# Patient Record
Sex: Female | Born: 1975 | Hispanic: No | Marital: Married | Smoking: Never smoker
Health system: Southern US, Community
[De-identification: ages and names within clinical notes are randomized; demographics above are authoritative.]

## PROBLEM LIST (undated history)

## (undated) DIAGNOSIS — N200 Calculus of kidney: Secondary | ICD-10-CM

## (undated) HISTORY — PX: LITHOTRIPSY: SUR834

---

## 2014-11-05 DIAGNOSIS — Z87440 Personal history of urinary (tract) infections: Secondary | ICD-10-CM | POA: Diagnosis not present

## 2014-11-05 DIAGNOSIS — Z792 Long term (current) use of antibiotics: Secondary | ICD-10-CM | POA: Insufficient documentation

## 2014-11-05 DIAGNOSIS — N200 Calculus of kidney: Secondary | ICD-10-CM | POA: Diagnosis not present

## 2014-11-05 DIAGNOSIS — Z3202 Encounter for pregnancy test, result negative: Secondary | ICD-10-CM | POA: Diagnosis not present

## 2014-11-05 DIAGNOSIS — Z79899 Other long term (current) drug therapy: Secondary | ICD-10-CM | POA: Diagnosis not present

## 2014-11-05 DIAGNOSIS — R109 Unspecified abdominal pain: Secondary | ICD-10-CM | POA: Diagnosis present

## 2014-11-06 ENCOUNTER — Emergency Department (HOSPITAL_COMMUNITY): Payer: 59

## 2014-11-06 ENCOUNTER — Encounter (HOSPITAL_COMMUNITY): Payer: Self-pay | Admitting: *Deleted

## 2014-11-06 ENCOUNTER — Emergency Department (HOSPITAL_COMMUNITY)
Admission: EM | Admit: 2014-11-06 | Discharge: 2014-11-06 | Disposition: A | Discharge status: Home / Self Care | Payer: 59 | Attending: Emergency Medicine | Admitting: Emergency Medicine

## 2014-11-06 DIAGNOSIS — N200 Calculus of kidney: Secondary | ICD-10-CM

## 2014-11-06 HISTORY — DX: Calculus of kidney: N20.0

## 2014-11-06 LAB — CBC WITH DIFFERENTIAL/PLATELET
BASOS PCT: 0 % (ref 0–1)
Basophils Absolute: 0 10*3/uL (ref 0.0–0.1)
EOS ABS: 0 10*3/uL (ref 0.0–0.7)
Eosinophils Relative: 0 % (ref 0–5)
HCT: 32.3 % — ABNORMAL LOW (ref 36.0–46.0)
Hemoglobin: 10.5 g/dL — ABNORMAL LOW (ref 12.0–15.0)
LYMPHS ABS: 1 10*3/uL (ref 0.7–4.0)
Lymphocytes Relative: 8 % — ABNORMAL LOW (ref 12–46)
MCH: 30 pg (ref 26.0–34.0)
MCHC: 32.5 g/dL (ref 30.0–36.0)
MCV: 92.3 fL (ref 78.0–100.0)
MONO ABS: 0.4 10*3/uL (ref 0.1–1.0)
Monocytes Relative: 4 % (ref 3–12)
NEUTROS PCT: 88 % — AB (ref 43–77)
Neutro Abs: 9.9 10*3/uL — ABNORMAL HIGH (ref 1.7–7.7)
PLATELETS: 250 10*3/uL (ref 150–400)
RBC: 3.5 MIL/uL — ABNORMAL LOW (ref 3.87–5.11)
RDW: 13 % (ref 11.5–15.5)
WBC: 11.3 10*3/uL — ABNORMAL HIGH (ref 4.0–10.5)

## 2014-11-06 LAB — COMPREHENSIVE METABOLIC PANEL
ALT: 13 U/L (ref 0–35)
AST: 12 U/L (ref 0–37)
Albumin: 3.2 g/dL — ABNORMAL LOW (ref 3.5–5.2)
Alkaline Phosphatase: 48 U/L (ref 39–117)
Anion gap: 3 — ABNORMAL LOW (ref 5–15)
BUN: 16 mg/dL (ref 6–23)
CHLORIDE: 113 mmol/L — AB (ref 96–112)
CO2: 23 mmol/L (ref 19–32)
Calcium: 7.4 mg/dL — ABNORMAL LOW (ref 8.4–10.5)
Creatinine, Ser: 0.52 mg/dL (ref 0.50–1.10)
GFR calc Af Amer: >90 mL/min (ref >90)
GFR calc non Af Amer: >90 mL/min (ref >90)
Glucose, Bld: 129 mg/dL — ABNORMAL HIGH (ref 70–99)
POTASSIUM: 3.7 mmol/L (ref 3.5–5.1)
Sodium: 139 mmol/L (ref 135–145)
Total Bilirubin: 0.3 mg/dL (ref 0.3–1.2)
Total Protein: 5.7 g/dL — ABNORMAL LOW (ref 6.0–8.3)

## 2014-11-06 LAB — URINALYSIS, ROUTINE W REFLEX MICROSCOPIC
Bilirubin Urine: NEGATIVE
Glucose, UA: NEGATIVE mg/dL
Ketones, ur: NEGATIVE mg/dL
NITRITE: NEGATIVE
Protein, ur: 30 mg/dL — AB
Specific Gravity, Urine: 1.025 (ref 1.005–1.030)
Urobilinogen, UA: 0.2 mg/dL (ref 0.0–1.0)
pH: 5.5 (ref 5.0–8.0)

## 2014-11-06 LAB — POC URINE PREG, ED: PREG TEST UR: NEGATIVE

## 2014-11-06 LAB — URINE MICROSCOPIC-ADD ON

## 2014-11-06 LAB — LIPASE, BLOOD: LIPASE: 21 U/L (ref 11–59)

## 2014-11-06 MED ORDER — ONDANSETRON HCL 4 MG/2ML IJ SOLN
4.0000 mg | Freq: Once | INTRAMUSCULAR | Status: AC
  Administered 2014-11-06: 4 mg via INTRAVENOUS
  Filled 2014-11-06: qty 2

## 2014-11-06 MED ORDER — MORPHINE SULFATE 4 MG/ML IJ SOLN
4.0000 mg | Freq: Once | INTRAMUSCULAR | Status: AC
  Administered 2014-11-06: 4 mg via INTRAVENOUS
  Filled 2014-11-06: qty 1

## 2014-11-06 MED ORDER — ONDANSETRON 4 MG PO TBDP: 4.0000 mg | ORAL_TABLET | Freq: Three times a day (TID) | ORAL | Status: DC | PRN

## 2014-11-06 MED ORDER — IOHEXOL 300 MG/ML  SOLN: 50.0000 mL | Freq: Once | INTRAMUSCULAR | Status: DC | PRN

## 2014-11-06 MED ORDER — SODIUM CHLORIDE 0.9 % IV SOLN
Freq: Once | INTRAVENOUS | Status: AC
  Administered 2014-11-06: 03:00:00 via INTRAVENOUS

## 2014-11-06 MED ORDER — SODIUM CHLORIDE 0.9 % IV BOLUS (SEPSIS)
1000.0000 mL | Freq: Once | INTRAVENOUS | Status: AC
  Administered 2014-11-06: 1000 mL via INTRAVENOUS

## 2014-11-06 MED ORDER — CEFTRIAXONE SODIUM 1 G IJ SOLR
1.0000 g | Freq: Once | INTRAMUSCULAR | Status: AC
  Administered 2014-11-06: 1 g via INTRAVENOUS
  Filled 2014-11-06: qty 10

## 2014-11-06 MED ORDER — OXYCODONE-ACETAMINOPHEN 5-325 MG PO TABS: 2.0000 | ORAL_TABLET | ORAL | Status: DC | PRN

## 2014-11-06 MED ORDER — HYDROMORPHONE HCL 1 MG/ML IJ SOLN
1.0000 mg | Freq: Once | INTRAMUSCULAR | Status: AC
  Administered 2014-11-06: 1 mg via INTRAVENOUS
  Filled 2014-11-06: qty 1

## 2014-11-06 MED ORDER — KETOROLAC TROMETHAMINE 30 MG/ML IJ SOLN
30.0000 mg | Freq: Once | INTRAMUSCULAR | Status: AC
  Administered 2014-11-06: 30 mg via INTRAVENOUS
  Filled 2014-11-06: qty 1

## 2014-11-06 NOTE — Discharge Instructions (Signed)
Take Percocet as needed for pain. Take zofran as needed for nausea. Refer to attached documents for more information.  °

## 2014-11-06 NOTE — ED Notes (Signed)
Patient being treated currently for kidney infection with antibiotics by mouth. According to EMS, the patient was doubled over in pain upon arrival. Patient has had blood in her urine since being seen for kidney infection.

## 2014-11-06 NOTE — ED Notes (Signed)
POC Preg Urine Neg.

## 2014-11-06 NOTE — ED Notes (Signed)
Bed: WA21 Expected date:  Expected time:  Means of arrival:  Comments: 26F Abd pain

## 2014-11-06 NOTE — ED Provider Notes (Signed)
CSN: [id number]     Arrival date & time 11/05/14  2359 History   First MD Initiated Contact with Patient 11/06/14 0048     Chief Complaint  Patient presents with  . Abdominal Pain     (Consider location/radiation/quality/duration/timing/severity/associated sxs/prior Treatment) HPI Comments: Patient is a 39 year old female who presents with flank pain since yesterday. The pain is located in the right flank and radiates to her right abdomen. The pain is described as sharp and severe. The pain started gradually and progressively worsened since the onset. Patient reports recently being treated for a UTI with Cipro. She thinks she has had the UTI for so long that her kidneys are infection. No alleviating/aggravating factors. The patient has tried nothing for symptoms without relief. Associated symptoms include hematuria. Patient denies fever, headache, NVD, chest pain, SOB, dysuria, constipation, abnormal vaginal bleeding/discharge. No history of abdominal surgery.      Past Medical History  Diagnosis Date  . Kidney stones   . Kidney stone   . Kidney stone    History reviewed. No pertinent past surgical history. No family history on file. History  Substance Use Topics  . Smoking status: Never Smoker   . Smokeless tobacco: Not on file  . Alcohol Use: Yes     Comment: rarely   OB History    No data available     Review of Systems  Constitutional: Negative for fever, chills and fatigue.  HENT: Negative for trouble swallowing.   Eyes: Negative for visual disturbance.  Respiratory: Negative for shortness of breath.   Cardiovascular: Negative for chest pain and palpitations.  Gastrointestinal: Negative for nausea, vomiting, abdominal pain and diarrhea.  Genitourinary: Positive for hematuria and flank pain. Negative for dysuria and difficulty urinating.  Musculoskeletal: Negative for arthralgias and neck pain.  Skin: Negative for color change.  Neurological: Negative for dizziness and  weakness.  Psychiatric/Behavioral: Negative for dysphoric mood.      Allergies  Review of patient's allergies indicates no known allergies.  Home Medications   Prior to Admission medications   Medication Sig Start Date End Date Taking? Authorizing Provider  Azelastine-Fluticasone 137-50 MCG/ACT SUSP Place 1 spray into the nose daily.   Yes Historical Provider, MD  ciprofloxacin (CIPRO) 500 MG tablet Take 500 mg by mouth 2 (two) times daily.   Yes Historical Provider, MD   BP 105/67 mmHg  Pulse 82  Temp(Src) 98 F (36.7 C) (Oral)  Resp 19  Ht  (1.575 m)  Wt 118 lb (53.524 kg)  BMI 21.58 kg/m2  SpO2 100%  LMP 10/07/2014 (Exact Date) Physical Exam  Constitutional: She is oriented to person, place, and time. She appears well-developed and well-nourished. No distress.  HENT:  Head: Normocephalic and atraumatic.  Eyes: Conjunctivae and EOM are normal.  Neck: Normal range of motion.  Cardiovascular: Normal rate and regular rhythm.  Exam reveals no gallop and no friction rub.   No murmur heard. Pulmonary/Chest: Effort normal and breath sounds normal. She has no wheezes. She has no rales. She exhibits no tenderness.  Abdominal: Soft. She exhibits no distension. There is tenderness. There is no rebound.  Mild right sided abdominal tenderness to palpation. No focal tenderness or peritoneal signs.   Genitourinary:  Right CVA tenderness.   Musculoskeletal: Normal range of motion.  Neurological: She is alert and oriented to person, place, and time. Coordination normal.  Speech is goal-oriented. Moves limbs without ataxia.   Skin: Skin is warm and dry.  Psychiatric: She has  a normal mood and affect. Her behavior is normal.  Nursing note and vitals reviewed.   ED Course  Procedures (including critical care time) Labs Review Labs Reviewed  URINALYSIS, ROUTINE W REFLEX MICROSCOPIC - Abnormal; Notable for the following:    Color, Urine RED (*)    APPearance TURBID (*)    Hgb  urine dipstick LARGE (*)    Protein, ur 30 (*)    Leukocytes, UA MODERATE (*)    All other components within normal limits  CBC WITH DIFFERENTIAL/PLATELET - Abnormal; Notable for the following:    WBC 11.3 (*)    RBC 3.50 (*)    Hemoglobin 10.5 (*)    HCT 32.3 (*)    Neutrophils Relative % 88 (*)    Neutro Abs 9.9 (*)    Lymphocytes Relative 8 (*)    All other components within normal limits  COMPREHENSIVE METABOLIC PANEL - Abnormal; Notable for the following:    Chloride 113 (*)    Glucose, Bld 129 (*)    Calcium 7.4 (*)    Total Protein 5.7 (*)    Albumin 3.2 (*)    Anion gap 3 (*)    All other components within normal limits  LIPASE, BLOOD  URINE MICROSCOPIC-ADD ON  POC URINE PREG, ED    Imaging Review Ct Abdomen Pelvis Wo Contrast  11/06/2014   CLINICAL DATA:  Right flank pain for 2 weeks.  EXAM: CT ABDOMEN AND PELVIS WITHOUT CONTRAST  TECHNIQUE: Multidetector CT imaging of the abdomen and pelvis was performed following the standard protocol without IV contrast.  COMPARISON:  None.  FINDINGS: There is hydronephrosis and ureteral dilatation on the right. There is a 2 mm distal right ureteral calculus approximately 2 cm proximal to the ureteral vesicle junction. This is seen to best advantage on coronal image 50. No other ureteral calculi are evident. There are 3-4 mm collecting system calculi in both kidneys.  There are unremarkable unenhanced appearances of the liver, spleen, pancreas and adrenals.Mesentery and bowel appear unremarkable. Appendix is normal. No significant abnormalities are evident in the lower chest. Incidentally noted small fat containing umbilical hernia.  IMPRESSION: 2 mm distal right ureteral calculus with hydroureter and hydronephrosis. Bilateral nephrolithiasis. Small fat containing umbilical hernia.   Electronically Signed   By: Ellery Plunkaniel R Mitchell M.D.   On: 11/06/2014 03:42     EKG Interpretation None      MDM   Final diagnoses:  Kidney stone on  right side    2:14 AM Labs pending. Urinalysis shows blood with leukocytes. Vitals stable and patient afebrile.   4:15 AM Patient's CT shows right uretal stone. Patient reports relief after toradol. Patient will be discharged with percocet and zofran. Vitals stable and patient afebrile. Patient instructed to return with worsening or concerning symptoms.   [address]Jearld Hemp FreeportSzekalski, PA-C 11/06/14 [id number]  Loren Raceravid Yelverton, MD 11/06/14 [phone]

## 2015-08-04 ENCOUNTER — Emergency Department (HOSPITAL_BASED_OUTPATIENT_CLINIC_OR_DEPARTMENT_OTHER)
Admission: EM | Admit: 2015-08-04 | Discharge: 2015-08-05 | Disposition: A | Discharge status: Home / Self Care | Payer: 59 | Attending: Emergency Medicine | Admitting: Emergency Medicine

## 2015-08-04 ENCOUNTER — Encounter (HOSPITAL_BASED_OUTPATIENT_CLINIC_OR_DEPARTMENT_OTHER): Payer: Self-pay | Admitting: *Deleted

## 2015-08-04 ENCOUNTER — Emergency Department (HOSPITAL_BASED_OUTPATIENT_CLINIC_OR_DEPARTMENT_OTHER): Payer: 59

## 2015-08-04 DIAGNOSIS — Z79899 Other long term (current) drug therapy: Secondary | ICD-10-CM | POA: Insufficient documentation

## 2015-08-04 DIAGNOSIS — M6281 Muscle weakness (generalized): Secondary | ICD-10-CM | POA: Diagnosis not present

## 2015-08-04 DIAGNOSIS — R319 Hematuria, unspecified: Secondary | ICD-10-CM | POA: Diagnosis present

## 2015-08-04 DIAGNOSIS — Z792 Long term (current) use of antibiotics: Secondary | ICD-10-CM | POA: Insufficient documentation

## 2015-08-04 DIAGNOSIS — Z87442 Personal history of urinary calculi: Secondary | ICD-10-CM | POA: Insufficient documentation

## 2015-08-04 DIAGNOSIS — N390 Urinary tract infection, site not specified: Secondary | ICD-10-CM

## 2015-08-04 DIAGNOSIS — Z3202 Encounter for pregnancy test, result negative: Secondary | ICD-10-CM | POA: Insufficient documentation

## 2015-08-04 DIAGNOSIS — Z7951 Long term (current) use of inhaled steroids: Secondary | ICD-10-CM | POA: Insufficient documentation

## 2015-08-04 DIAGNOSIS — R509 Fever, unspecified: Secondary | ICD-10-CM

## 2015-08-04 DIAGNOSIS — R109 Unspecified abdominal pain: Secondary | ICD-10-CM

## 2015-08-04 LAB — CBC
HCT: 34.3 % — ABNORMAL LOW (ref 36.0–46.0)
Hemoglobin: 11.3 g/dL — ABNORMAL LOW (ref 12.0–15.0)
MCH: 28.6 pg (ref 26.0–34.0)
MCHC: 32.9 g/dL (ref 30.0–36.0)
MCV: 86.8 fL (ref 78.0–100.0)
PLATELETS: 249 10*3/uL (ref 150–400)
RBC: 3.95 MIL/uL (ref 3.87–5.11)
RDW: 12.8 % (ref 11.5–15.5)
WBC: 4.2 10*3/uL (ref 4.0–10.5)

## 2015-08-04 LAB — URINALYSIS, ROUTINE W REFLEX MICROSCOPIC
Glucose, UA: NEGATIVE mg/dL
Ketones, ur: 15 mg/dL — AB
Nitrite: POSITIVE — AB
PROTEIN: 100 mg/dL — AB
SPECIFIC GRAVITY, URINE: 1.026 (ref 1.005–1.030)
pH: 7 (ref 5.0–8.0)

## 2015-08-04 LAB — URINE MICROSCOPIC-ADD ON

## 2015-08-04 LAB — BASIC METABOLIC PANEL
Anion gap: 6 (ref 5–15)
BUN: 11 mg/dL (ref 6–20)
CALCIUM: 8.5 mg/dL — AB (ref 8.9–10.3)
CO2: 26 mmol/L (ref 22–32)
CREATININE: 0.51 mg/dL (ref 0.44–1.00)
Chloride: 104 mmol/L (ref 101–111)
GFR calc Af Amer: >60 mL/min (ref >60)
GLUCOSE: 127 mg/dL — AB (ref 65–99)
Potassium: 3.7 mmol/L (ref 3.5–5.1)
SODIUM: 136 mmol/L (ref 135–145)

## 2015-08-04 LAB — PREGNANCY, URINE: PREG TEST UR: NEGATIVE

## 2015-08-04 MED ORDER — ONDANSETRON HCL 4 MG PO TABS: 4.0000 mg | ORAL_TABLET | Freq: Four times a day (QID) | ORAL | Status: DC

## 2015-08-04 MED ORDER — CEFTRIAXONE SODIUM 1 G IJ SOLR
1.0000 g | Freq: Once | INTRAMUSCULAR | Status: AC
  Administered 2015-08-04: 1 g via INTRAVENOUS

## 2015-08-04 MED ORDER — FLUCONAZOLE 150 MG PO TABS: 150.0000 mg | ORAL_TABLET | Freq: Every day | ORAL | Status: AC

## 2015-08-04 MED ORDER — CEPHALEXIN 500 MG PO CAPS: 500.0000 mg | ORAL_CAPSULE | Freq: Four times a day (QID) | ORAL | Status: DC

## 2015-08-04 MED ORDER — CEFTRIAXONE SODIUM 1 G IJ SOLR
INTRAMUSCULAR | Status: AC
  Filled 2015-08-04: qty 10

## 2015-08-04 MED ORDER — KETOROLAC TROMETHAMINE 30 MG/ML IJ SOLN
30.0000 mg | Freq: Once | INTRAMUSCULAR | Status: AC
  Administered 2015-08-04: 30 mg via INTRAVENOUS
  Filled 2015-08-04: qty 1

## 2015-08-04 MED ORDER — SODIUM CHLORIDE 0.9 % IV BOLUS (SEPSIS)
1000.0000 mL | Freq: Once | INTRAVENOUS | Status: AC
  Administered 2015-08-04: 1000 mL via INTRAVENOUS

## 2015-08-04 NOTE — ED Notes (Signed)
Hematuria x 2 weeks- hx of kidney stones- went to PCP today and sent here for eval- Fever x 3 days

## 2015-08-04 NOTE — Discharge Instructions (Signed)
Return to the ED with any concerns including vomiting and not able to keep down liquids, fever/chills, worsening abdominal pain, decreased level of alertness/lethargy, or any other alarming symptoms °

## 2015-08-04 NOTE — ED Notes (Signed)
Patient asked if she could eat advised her not to until doctor sees her per suzanne RN

## 2015-08-04 NOTE — ED Provider Notes (Signed)
CSN: [id number]     Arrival date & time 08/04/15  1751 History  By signing my name below, I, Doreatha MartinEva Mathews, attest that this documentation has been prepared under the direction and in the presence of Jerelyn ScottMartha Linker, MD. Electronically Signed: Doreatha MartinEva Mathews, ED Scribe. 08/04/2015. 9:42 PM.    Chief Complaint  Patient presents with  . Hematuria   Patient is a 39 y.o. female presenting with hematuria. The history is provided by the patient. No language interpreter was used.  Hematuria This is a recurrent problem. The current episode started more than 1 week ago. The problem occurs daily. The problem has not changed since onset.Associated symptoms include abdominal pain. Nothing aggravates the symptoms. Nothing relieves the symptoms. She has tried nothing for the symptoms. The treatment provided no relief.    HPI Comments: Krista Peterson is a 39 y.o. female who presents to the Emergency Department complaining of moderate hematuria onset 2 weeks ago with associated fever and chills onset 3 days ago, bilateral flank pain (worse on the right) for a week, generalized weakness, periumbilical and lower abdominal pain, dysuria. Pt was evaluated by her PCP earlier today who referred her to the ED. Pt states h/o similar symptoms with renal calculi and lithotripsy. Pt notes that she has had hematuria with prior kidney stones. Pt is followed by [facility]. NKDA. She denies emesis, nausea.   Past Medical History  Diagnosis Date  . Kidney stones   . Kidney stone   . Kidney stone    Past Surgical History  Procedure Laterality Date  . Lithotripsy     No family history on file. Social History  Substance Use Topics  . Smoking status: Never Smoker   . Smokeless tobacco: None  . Alcohol Use: Yes     Comment: rarely   OB History    No data available     Review of Systems  Constitutional: Positive for fever and chills.  Gastrointestinal: Positive for abdominal pain. Negative for nausea and vomiting.   Genitourinary: Positive for dysuria, hematuria and flank pain.  Neurological: Positive for weakness.  All other systems reviewed and are negative.  Allergies  Review of patient's allergies indicates no known allergies.  Home Medications   Prior to Admission medications   Medication Sig Start Date End Date Taking? Authorizing Provider  Azelastine-Fluticasone 137-50 MCG/ACT SUSP Place 1 spray into the nose daily.    Historical Provider, MD  cephALEXin (KEFLEX) 500 MG capsule Take 1 capsule (500 mg total) by mouth 4 (four) times daily. 08/04/15   Jerelyn ScottMartha Linker, MD  ciprofloxacin (CIPRO) 500 MG tablet Take 500 mg by mouth 2 (two) times daily.    Historical Provider, MD  fluconazole (DIFLUCAN) 150 MG tablet Take 1 tablet (150 mg total) by mouth daily. 08/04/15 08/11/15  Jerelyn ScottMartha Linker, MD  ondansetron (ZOFRAN ODT) 4 MG disintegrating tablet Take 1 tablet (4 mg total) by mouth every 8 (eight) hours as needed for nausea or vomiting. 11/06/14   Emilia BeckKaitlyn Szekalski, PA-C  ondansetron (ZOFRAN) 4 MG tablet Take 1 tablet (4 mg total) by mouth every 6 (six) hours. 08/04/15   Jerelyn ScottMartha Linker, MD  oxyCODONE-acetaminophen (PERCOCET/ROXICET) 5-325 MG per tablet Take 2 tablets by mouth every 4 (four) hours as needed for moderate pain or severe pain. 11/06/14   Kaitlyn Szekalski, PA-C   BP 95/60 mmHg  Pulse 93  Temp(Src) 98.9 F (37.2 C) (Oral)  Resp 18  Ht 5\' 2"  (1.575 m)  Wt 116 lb (52.617 kg)  BMI 21.21  kg/m2  SpO2 99%  LMP 07/31/2015  Vitals reviewed Physical Exam  Physical Examination: General appearance - alert, well appearing, and in no distress Mental status - alert, oriented to person, place, and time Eyes - no conjunctival injection, no scleral icterus Mouth - mucous membranes moist, pharynx normal without lesions Chest - clear to auscultation, no wheezes, rales or rhonchi, symmetric air entry Heart - normal rate, regular rhythm, normal S1, S2, no murmurs, rubs, clicks or gallops Abdomen -  soft, nontender, nondistended, no masses or organomegaly Back exam - no midline tenderness to palpation, no CVA tenderness Neurological - alert, oriented, normal speech Extremities - peripheral pulses normal, no pedal edema, no clubbing or cyanosis Skin - normal coloration and turgor, no rashes  ED Course  Procedures (including critical care time) DIAGNOSTIC STUDIES: Oxygen Saturation is 99% on RA, normal by my interpretation.    COORDINATION OF CARE: 9:42 PM Discussed treatment plan with pt at bedside and pt agreed to plan.   Labs Review Labs Reviewed  URINALYSIS, ROUTINE W REFLEX MICROSCOPIC (NOT AT [facility]) - Abnormal; Notable for the following:    Color, Urine RED (*)    APPearance TURBID (*)    Hgb urine dipstick LARGE (*)    Bilirubin Urine SMALL (*)    Ketones, ur 15 (*)    Protein, ur 100 (*)    Nitrite POSITIVE (*)    Leukocytes, UA MODERATE (*)    All other components within normal limits  URINE MICROSCOPIC-ADD ON - Abnormal; Notable for the following:    Squamous Epithelial / LPF 0-5 (*)    Bacteria, UA MANY (*)    All other components within normal limits  CBC - Abnormal; Notable for the following:    Hemoglobin 11.3 (*)    HCT 34.3 (*)    All other components within normal limits  BASIC METABOLIC PANEL - Abnormal; Notable for the following:    Glucose, Bld 127 (*)    Calcium 8.5 (*)    All other components within normal limits  PREGNANCY, URINE    Imaging Review Dg Chest 2 View  08/04/2015  CLINICAL DATA:  Fever and chills for 3 days. Bilateral flank pain and hematuria. EXAM: CHEST  2 VIEW COMPARISON:  Abdominal CT earlier this day FINDINGS: Small right pleural effusion with adjacent minimal basilar opacity, most consistent with compressive atelectasis. The heart size and mediastinal contours are normal. There is no pulmonary edema. No left pleural effusion. No pneumothorax. No osseous abnormality. IMPRESSION: Small right pleural effusion with adjacent basilar  opacity, likely atelectasis; however pneumonia with small parapneumonic effusion is considered in the setting of fever. Electronically Signed   By: Rubye Oaks M.D.   On: 08/04/2015 22:59   Ct Renal Stone Study  08/04/2015  CLINICAL DATA:  Right-sided flank pain and hematuria for 1-2 weeks. Onset of fever and chills 3 days ago. EXAM: CT ABDOMEN AND PELVIS WITHOUT CONTRAST TECHNIQUE: Multidetector CT imaging of the abdomen and pelvis was performed following the standard protocol without IV contrast. COMPARISON:  04/21/2015 FINDINGS: Lower chest:  Right pleural effusion is new since prior exam. Hepatobiliary: No mass visualized on this un-enhanced exam. Gallbladder is unremarkable. Pancreas: No mass or inflammatory process identified on this un-enhanced exam. Spleen: Within normal limits in size. Adrenals/Urinary Tract: Several tiny less than 5 mm intrarenal calculi seen bilaterally on coronal images. No evidence ureteral calculi or hydronephrosis. Stomach/Bowel: No evidence of obstruction, inflammatory process, or abnormal fluid collections. Normal appendix visualized. Vascular/Lymphatic: No pathologically  enlarged lymph nodes. No evidence of abdominal aortic aneurysm. Reproductive: No mass or other significant abnormality. Other: None. Musculoskeletal:  No suspicious bone lesions identified. IMPRESSION: Bilateral nonobstructive nephrolithiasis. No evidence of ureteral calculi or hydronephrosis. New right pleural effusion noted. Consider chest radiograph for further evaluation. Electronically Signed   By: Myles Rosenthal M.D.   On: 08/04/2015 22:14   I have personally reviewed and evaluated these images and lab results as part of my medical decision-making.  MDM   Final diagnoses:  UTI (lower urinary tract infection)  Febrile illness    Pt presenting with c/o hematuria, low grade fever.  Workup reveals UTI.  No obstructing renal stones.  Abdominal exam is benign and patient is overall nontoxic and  well hydrated in appearance.  Pt given IV fluids and IV rocephin.  Discharged with rx for keflex for UTI.  Discharged with strict return precautions.  Pt agreeable with plan.   I personally performed the services described in this documentation, which was scribed in my presence. The recorded information has been reviewed and is accurate.    Jerelyn Scott, MD 08/06/15 [phone]

## 2015-08-06 ENCOUNTER — Telehealth (HOSPITAL_COMMUNITY): Payer: Self-pay

## 2015-08-07 ENCOUNTER — Emergency Department (HOSPITAL_BASED_OUTPATIENT_CLINIC_OR_DEPARTMENT_OTHER): Payer: 59

## 2015-08-07 ENCOUNTER — Emergency Department (HOSPITAL_BASED_OUTPATIENT_CLINIC_OR_DEPARTMENT_OTHER)
Admission: EM | Admit: 2015-08-07 | Discharge: 2015-08-07 | Disposition: A | Discharge status: Home / Self Care | Payer: 59 | Attending: Emergency Medicine | Admitting: Emergency Medicine

## 2015-08-07 ENCOUNTER — Encounter (HOSPITAL_BASED_OUTPATIENT_CLINIC_OR_DEPARTMENT_OTHER): Payer: Self-pay | Admitting: Emergency Medicine

## 2015-08-07 DIAGNOSIS — Z87442 Personal history of urinary calculi: Secondary | ICD-10-CM | POA: Insufficient documentation

## 2015-08-07 DIAGNOSIS — N3090 Cystitis, unspecified without hematuria: Secondary | ICD-10-CM | POA: Diagnosis not present

## 2015-08-07 DIAGNOSIS — R531 Weakness: Secondary | ICD-10-CM | POA: Diagnosis not present

## 2015-08-07 DIAGNOSIS — Z79899 Other long term (current) drug therapy: Secondary | ICD-10-CM | POA: Insufficient documentation

## 2015-08-07 DIAGNOSIS — K5900 Constipation, unspecified: Secondary | ICD-10-CM | POA: Diagnosis not present

## 2015-08-07 DIAGNOSIS — M546 Pain in thoracic spine: Secondary | ICD-10-CM | POA: Diagnosis present

## 2015-08-07 DIAGNOSIS — Z3202 Encounter for pregnancy test, result negative: Secondary | ICD-10-CM | POA: Insufficient documentation

## 2015-08-07 DIAGNOSIS — Z792 Long term (current) use of antibiotics: Secondary | ICD-10-CM | POA: Insufficient documentation

## 2015-08-07 LAB — URINE MICROSCOPIC-ADD ON

## 2015-08-07 LAB — URINALYSIS, ROUTINE W REFLEX MICROSCOPIC
BILIRUBIN URINE: NEGATIVE
Glucose, UA: NEGATIVE mg/dL
KETONES UR: NEGATIVE mg/dL
NITRITE: NEGATIVE
PROTEIN: 30 mg/dL — AB
SPECIFIC GRAVITY, URINE: 1.006 (ref 1.005–1.030)
pH: 6.5 (ref 5.0–8.0)

## 2015-08-07 LAB — COMPREHENSIVE METABOLIC PANEL
ALK PHOS: 63 U/L (ref 38–126)
ALT: 14 U/L (ref 14–54)
AST: 12 U/L — AB (ref 15–41)
Albumin: 2.9 g/dL — ABNORMAL LOW (ref 3.5–5.0)
Anion gap: 6 (ref 5–15)
BILIRUBIN TOTAL: 0.4 mg/dL (ref 0.3–1.2)
BUN: 9 mg/dL (ref 6–20)
CALCIUM: 8.2 mg/dL — AB (ref 8.9–10.3)
CO2: 25 mmol/L (ref 22–32)
CREATININE: 0.48 mg/dL (ref 0.44–1.00)
Chloride: 102 mmol/L (ref 101–111)
Glucose, Bld: 119 mg/dL — ABNORMAL HIGH (ref 65–99)
Potassium: 3.5 mmol/L (ref 3.5–5.1)
Sodium: 133 mmol/L — ABNORMAL LOW (ref 135–145)
TOTAL PROTEIN: 6.8 g/dL (ref 6.5–8.1)

## 2015-08-07 LAB — CBC WITH DIFFERENTIAL/PLATELET
BASOS PCT: 0 %
Basophils Absolute: 0 10*3/uL (ref 0.0–0.1)
EOS ABS: 0 10*3/uL (ref 0.0–0.7)
EOS PCT: 1 %
HCT: 33.1 % — ABNORMAL LOW (ref 36.0–46.0)
HEMOGLOBIN: 10.8 g/dL — AB (ref 12.0–15.0)
LYMPHS ABS: 0.8 10*3/uL (ref 0.7–4.0)
Lymphocytes Relative: 17 %
MCH: 27.8 pg (ref 26.0–34.0)
MCHC: 32.6 g/dL (ref 30.0–36.0)
MCV: 85.1 fL (ref 78.0–100.0)
Monocytes Absolute: 0.6 10*3/uL (ref 0.1–1.0)
Monocytes Relative: 13 %
NEUTROS PCT: 69 %
Neutro Abs: 3.4 10*3/uL (ref 1.7–7.7)
PLATELETS: 287 10*3/uL (ref 150–400)
RBC: 3.89 MIL/uL (ref 3.87–5.11)
RDW: 12.6 % (ref 11.5–15.5)
WBC: 4.9 10*3/uL (ref 4.0–10.5)

## 2015-08-07 LAB — PREGNANCY, URINE: PREG TEST UR: NEGATIVE

## 2015-08-07 MED ORDER — DEXTROSE 5 % IV SOLN
1.0000 g | Freq: Once | INTRAVENOUS | Status: AC
  Administered 2015-08-07: 1 g via INTRAVENOUS

## 2015-08-07 MED ORDER — IOHEXOL 350 MG/ML SOLN
100.0000 mL | Freq: Once | INTRAVENOUS | Status: AC | PRN
  Administered 2015-08-07: 100 mL via INTRAVENOUS

## 2015-08-07 MED ORDER — CEFPODOXIME PROXETIL 100 MG PO TABS: 100.0000 mg | ORAL_TABLET | Freq: Two times a day (BID) | ORAL | Status: DC

## 2015-08-07 MED ORDER — SODIUM CHLORIDE 0.9 % IV BOLUS (SEPSIS)
1000.0000 mL | Freq: Once | INTRAVENOUS | Status: AC
  Administered 2015-08-07: 1000 mL via INTRAVENOUS

## 2015-08-07 MED ORDER — CEFTRIAXONE SODIUM 1 G IJ SOLR
INTRAMUSCULAR | Status: AC
  Filled 2015-08-07: qty 10

## 2015-08-07 NOTE — ED Notes (Addendum)
Patient reports that after the treatment given to her from here and at her PMD offices she is not getting any better. Patient reports that she feels like she has a further infection. The first meds that she was given were making her too tired and now the meds that she was given have been keeping her awake. Patient reports that she is tired of having to deal with this and she wanted to be treated properly this time.

## 2015-08-07 NOTE — ED Notes (Signed)
MD at bedside. 

## 2015-08-07 NOTE — ED Notes (Signed)
Patient transported to CT 

## 2015-08-07 NOTE — ED Notes (Signed)
Patient transported to X-ray 

## 2015-08-07 NOTE — ED Provider Notes (Signed)
CSN: [id number]     Arrival date & time 08/07/15  1901 History  By signing my name below, I, Tanda Rockers, attest that this documentation has been prepared under the direction and in the presence of Mirian Mo, MD. Electronically Signed: Tanda Rockers, ED Scribe. 08/07/2015. 7:39 PM.  Chief Complaint  Patient presents with  . Back Pain   Patient is a 39 y.o. female presenting with back pain. The history is provided by the patient. No language interpreter was used.  Back Pain Location:  Thoracic spine Quality:  Unable to specify Radiates to:  Does not radiate Pain severity:  Moderate Pain is:  Same all the time Onset quality:  Gradual Duration:  1 week Timing:  Constant Progression:  Unchanged Chronicity:  New Context: not falling, not physical stress and not recent injury   Relieved by:  Nothing Ineffective treatments: Keflex. Associated symptoms: abdominal pain, fever and weakness (Generalized)   Associated symptoms: no dysuria      HPI Comments: Krista Peterson is a 39 y.o. female who presents to the Emergency Department complaining of gradual onset, constant, mid back pain, generalized weakness, myalgias, and hematuria x 1 week. Pt also reports dry cough, fever of 103, nausea, and constipation. She was seen in the ED on 08/04/2015 (approximately 3 days ago) by Dr. Karma Ganja. Pt was diagnosed with a UTI and given a prescription for Keflex without relief. Pt called her PCP's office and informed a physician that the Keflex was not giving her any relief. The physician changed her prescription to Levaquin today. Denies vomiting, diarrhea, dysuria, or any other associated symptoms. Pt has hx of kidney stones but states her symptoms do not feel like her kidney stone pain.   Past Medical History  Diagnosis Date  . Kidney stones   . Kidney stone   . Kidney stone    Past Surgical History  Procedure Laterality Date  . Lithotripsy     History reviewed. No pertinent family  history. Social History  Substance Use Topics  . Smoking status: Never Smoker   . Smokeless tobacco: None  . Alcohol Use: Yes     Comment: rarely   OB History    No data available     Review of Systems  Constitutional: Positive for fever.  Respiratory: Positive for cough.   Gastrointestinal: Positive for nausea, abdominal pain and constipation. Negative for vomiting and diarrhea.  Genitourinary: Positive for hematuria. Negative for dysuria.  Musculoskeletal: Positive for myalgias and back pain.  Neurological: Positive for weakness (Generalized).  All other systems reviewed and are negative.     Allergies  Review of patient's allergies indicates no known allergies.  Home Medications   Prior to Admission medications   Medication Sig Start Date End Date Taking? Authorizing Provider  Azelastine-Fluticasone 137-50 MCG/ACT SUSP Place 1 spray into the nose daily.    Historical Provider, MD  cefpodoxime (VANTIN) 100 MG tablet Take 1 tablet (100 mg total) by mouth 2 (two) times daily. 08/07/15   Mirian Mo, MD  ciprofloxacin (CIPRO) 500 MG tablet Take 500 mg by mouth 2 (two) times daily.    Historical Provider, MD  fluconazole (DIFLUCAN) 150 MG tablet Take 1 tablet (150 mg total) by mouth daily. 08/04/15 08/11/15  Jerelyn Scott, MD  ondansetron (ZOFRAN ODT) 4 MG disintegrating tablet Take 1 tablet (4 mg total) by mouth every 8 (eight) hours as needed for nausea or vomiting. 11/06/14   Emilia Beck, PA-C  ondansetron (ZOFRAN) 4 MG tablet Take 1 tablet (4  mg total) by mouth every 6 (six) hours. 08/04/15   Martha Linker, MD  oxyCODONE-acetaminophen (PERCOCET/ROXICET) 5-325 MG per tablet Take 2 tablets by mouth every 4 (four) hours as needed for moderate pain or severe pain. 11/06/14   Kaitlyn Szekalski, PA-C   Triage Vitals: BP 95/67 mmHg  Pulse 109  Temp(Src) 99.3 F (37.4 C) (Oral)  Resp 18  Ht 5\' 2"  (1.575 m)  Wt 116 lb (52.617 kg)  BMI 21.21 kg/m2  SpO2 99%  LMP  07/31/2015   Physical Exam  Constitutional: She is oriented to person, place, and time. She appears well-developed and well-nourished.  HENT:  Head: Normocephalic and atraumatic.  Right Ear: External ear normal.  Left Ear: External ear normal.  Eyes: Conjunctivae and EOM are normal. Pupils are equal, round, and reactive to light.  Neck: Normal range of motion. Neck supple.  Cardiovascular: Normal rate, regular rhythm, normal heart sounds and intact distal pulses.   Pulmonary/Chest: Effort normal and breath sounds normal.  Abdominal: Soft. Bowel sounds are normal. There is no tenderness. There is CVA tenderness (L).  Musculoskeletal: Normal range of motion.  Neurological: She is alert and oriented to person, place, and time.  Skin: Skin is warm and dry.  Vitals reviewed.   ED Course  Procedures (including critical care time)  DIAGNOSTIC STUDIES: Oxygen Saturation is 99% on RA, normal by my interpretation.    COORDINATION OF CARE: 7:38 PM-Discussed treatment plan which includes CXR, CMP, CBC, UA, and urine pregnancy with pt at bedside and pt agreed to plan.   Labs Review Labs Reviewed  COMPREHENSIVE METABOLIC PANEL - Abnormal; Notable for the following:    Sodium 133 (*)    Glucose, Bld 119 (*)    Calcium 8.2 (*)    Albumin 2.9 (*)    AST 12 (*)    All other components within normal limits  CBC WITH DIFFERENTIAL/PLATELET - Abnormal; Notable for the following:    Hemoglobin 10.8 (*)    HCT 33.1 (*)    All other components within normal limits  URINALYSIS, ROUTINE W REFLEX MICROSCOPIC (NOT AT [facility]) - Abnormal; Notable for the following:    APPearance CLOUDY (*)    Hgb urine dipstick LARGE (*)    Protein, ur 30 (*)    Leukocytes, UA TRACE (*)    All other components within normal limits  URINE MICROSCOPIC-ADD ON - Abnormal; Notable for the following:    Squamous Epithelial / LPF 6-30 (*)    Bacteria, UA RARE (*)    All other components within normal limits  PREGNANCY,  URINE    Imaging Review Dg Chest 2 View  08/07/2015  CLINICAL DATA:  Fever.  Low back pain. EXAM: CHEST  2 VIEW COMPARISON:  08/04/2015 FINDINGS: Small to moderate right pleural effusion with associated passive atelectasis. Cardiac and mediastinal margins appear normal. The left lung appears clear. IMPRESSION: 1. Enlarging right pleural effusion, now small to moderate in size, with associated passive atelectasis. No additional significant abnormalities observed. Electronically Signed   By: Walter  Liebkemann M.D.   On: 08/07/2015 20:26   Ct Angio Chest Pe W/cm &/or Wo Cm  08/07/2015  CLINICAL DATA:  Fever.  Enlarging pleural effusion.  Low back pain. EXAM: CT ANGIOGRAPHY CHEST WITH CONTRAST TECHNIQUE: Multidetector CT imaging of the chest was performed using the standard protocol during bolus administration of intravenous contrast. Multiplanar CT image reconstructions and MIPs were obtained to evaluate the vascular anatomy. CONTRAST:  <MEAS<MEASUR<MEASUREMENDani GobbleIOHEXOL 350 MG/ML SOLN COMPARISON:  Multiple exams, including 08/04/2015 FINDINGS: Mediastinum/Nodes: No filling defect is identified in the pulmonary arterial tree to suggest pulmonary embolus. No acute thoracic aortic findings. No pathologic thoracic adenopathy. There are some upper normal sized right infrahilar lymph nodes. Lungs/Pleura: Small to moderate right pleural effusion with passive atelectasis. Nonspecific for exudative versus transudative effusion. There is some small clustered nodules in the right lower lobe. For example, a nodule along a small airway measures 5 by 6 mm on image 53 of series 6, and there may be some tree-in-bud clustering in this vicinity. Subsegmental atelectasis in the right middle lobe. The left lung appears clear. Upper abdomen: Unremarkable Musculoskeletal: On images 143-153 of series 5, there is subtle linear lucency tracking through the medial most margin of the right eighth rib, quite possibly representing a small  fracture. This extends into the costovertebral joint. Review of the MIP images confirms the above findings. IMPRESSION: 1. Small to moderate right pleural effusion with passive atelectasis. There several possible etiologies. First, in the right lower lobe there is some clustered nodules reminiscent of atypical infectious bronchiolitis, an inflammatory etiology is a possibility. There is no enhancement along the pleural margin. It would be somewhat unusual for atypical infectious bronchiolitis to cause a pleural effusion. The second possibility is the suspected subacute fracture of the right medial most eighth rib. 2. The right lower lobe nodules do warrant follow up to exclude a neoplastic etiology. If the patient is at high risk for bronchogenic carcinoma, follow-up chest CT at 6-12 months is recommended. If the patient is at low risk for bronchogenic carcinoma, follow-up chest CT at 12 months is recommended. This recommendation follows the consensus statement: Guidelines for Management of Small Pulmonary Nodules Detected on CT Scans: A Statement from the Fleischner Society as published in Radiology 2005;237:395-400. 3. No filling defect is identified in the pulmonary arterial tree to suggest pulmonary embolus. No acute thoracic aortic findings. Electronically Signed   By: Gaylyn Rong M.D.   On: 08/07/2015 21:29   I have personally reviewed and evaluated these images and lab results as part of my medical decision-making.   EKG Interpretation None      MDM   Final diagnoses:  Cystitis    39 y.o. female with pertinent PMH of recent dx of UTI (post incomplete course of keflex and levaquin) presents with continued hematuria and fever.  On arrival vitals and physical exam as above.  Dorna Bloom revealed increasing effusion, subsequent CT scan with likely viral/inflammatory etiology.  Discussed this at length with pt and will have her take course of vantin.  DC home in stable condition.    I have reviewed  all laboratory and imaging studies if ordered as above  1. Cystitis           Mirian Mo, MD 08/07/15 2308

## 2015-08-07 NOTE — Discharge Instructions (Signed)

## 2015-09-06 ENCOUNTER — Other Ambulatory Visit: Payer: Self-pay | Admitting: Family Medicine

## 2015-09-06 ENCOUNTER — Ambulatory Visit
Admission: RE | Admit: 2015-09-06 | Discharge: 2015-09-06 | Disposition: A | Discharge status: Home / Self Care | Payer: 59 | Source: Ambulatory Visit | Attending: Family Medicine | Admitting: Family Medicine

## 2015-09-06 DIAGNOSIS — J690 Pneumonitis due to inhalation of food and vomit: Secondary | ICD-10-CM

## 2016-01-22 DIAGNOSIS — R109 Unspecified abdominal pain: Secondary | ICD-10-CM | POA: Diagnosis not present

## 2016-01-22 DIAGNOSIS — J029 Acute pharyngitis, unspecified: Secondary | ICD-10-CM | POA: Diagnosis not present

## 2016-01-22 DIAGNOSIS — J069 Acute upper respiratory infection, unspecified: Secondary | ICD-10-CM | POA: Diagnosis not present

## 2016-02-01 DIAGNOSIS — R3129 Other microscopic hematuria: Secondary | ICD-10-CM | POA: Diagnosis not present

## 2016-02-01 DIAGNOSIS — N200 Calculus of kidney: Secondary | ICD-10-CM | POA: Diagnosis not present

## 2016-02-01 DIAGNOSIS — Z0000 Encounter for general adult medical examination without abnormal findings: Secondary | ICD-10-CM | POA: Diagnosis not present

## 2016-02-01 DIAGNOSIS — M545 Low back pain: Secondary | ICD-10-CM | POA: Diagnosis not present

## 2016-05-01 DIAGNOSIS — R509 Fever, unspecified: Secondary | ICD-10-CM | POA: Diagnosis not present

## 2016-06-03 DIAGNOSIS — Z23 Encounter for immunization: Secondary | ICD-10-CM | POA: Diagnosis not present

## 2016-06-03 DIAGNOSIS — N9089 Other specified noninflammatory disorders of vulva and perineum: Secondary | ICD-10-CM | POA: Diagnosis not present

## 2016-06-03 DIAGNOSIS — N898 Other specified noninflammatory disorders of vagina: Secondary | ICD-10-CM | POA: Diagnosis not present

## 2016-08-22 DIAGNOSIS — J309 Allergic rhinitis, unspecified: Secondary | ICD-10-CM | POA: Diagnosis not present

## 2016-09-19 DIAGNOSIS — J0100 Acute maxillary sinusitis, unspecified: Secondary | ICD-10-CM | POA: Diagnosis not present

## 2016-09-22 DIAGNOSIS — R05 Cough: Secondary | ICD-10-CM | POA: Diagnosis not present

## 2016-09-22 DIAGNOSIS — B349 Viral infection, unspecified: Secondary | ICD-10-CM | POA: Diagnosis not present

## 2016-09-23 DIAGNOSIS — J029 Acute pharyngitis, unspecified: Secondary | ICD-10-CM | POA: Diagnosis not present

## 2016-09-23 DIAGNOSIS — R05 Cough: Secondary | ICD-10-CM | POA: Diagnosis not present

## 2016-09-23 DIAGNOSIS — J209 Acute bronchitis, unspecified: Secondary | ICD-10-CM | POA: Diagnosis not present

## 2016-09-29 DIAGNOSIS — J0100 Acute maxillary sinusitis, unspecified: Secondary | ICD-10-CM | POA: Diagnosis not present

## 2016-10-10 DIAGNOSIS — L298 Other pruritus: Secondary | ICD-10-CM | POA: Diagnosis not present

## 2016-10-10 DIAGNOSIS — D72818 Other decreased white blood cell count: Secondary | ICD-10-CM | POA: Diagnosis not present

## 2016-11-02 IMAGING — CR DG CHEST 2V
2 series · 2 of 2 positions shown · non-contrast
Comparison: Abdominal CT earlier this day

CLINICAL DATA: Fever and chills for 3 days. Bilateral flank pain
and hematuria.

EXAM:
CHEST  2 VIEW

[w chest pa]
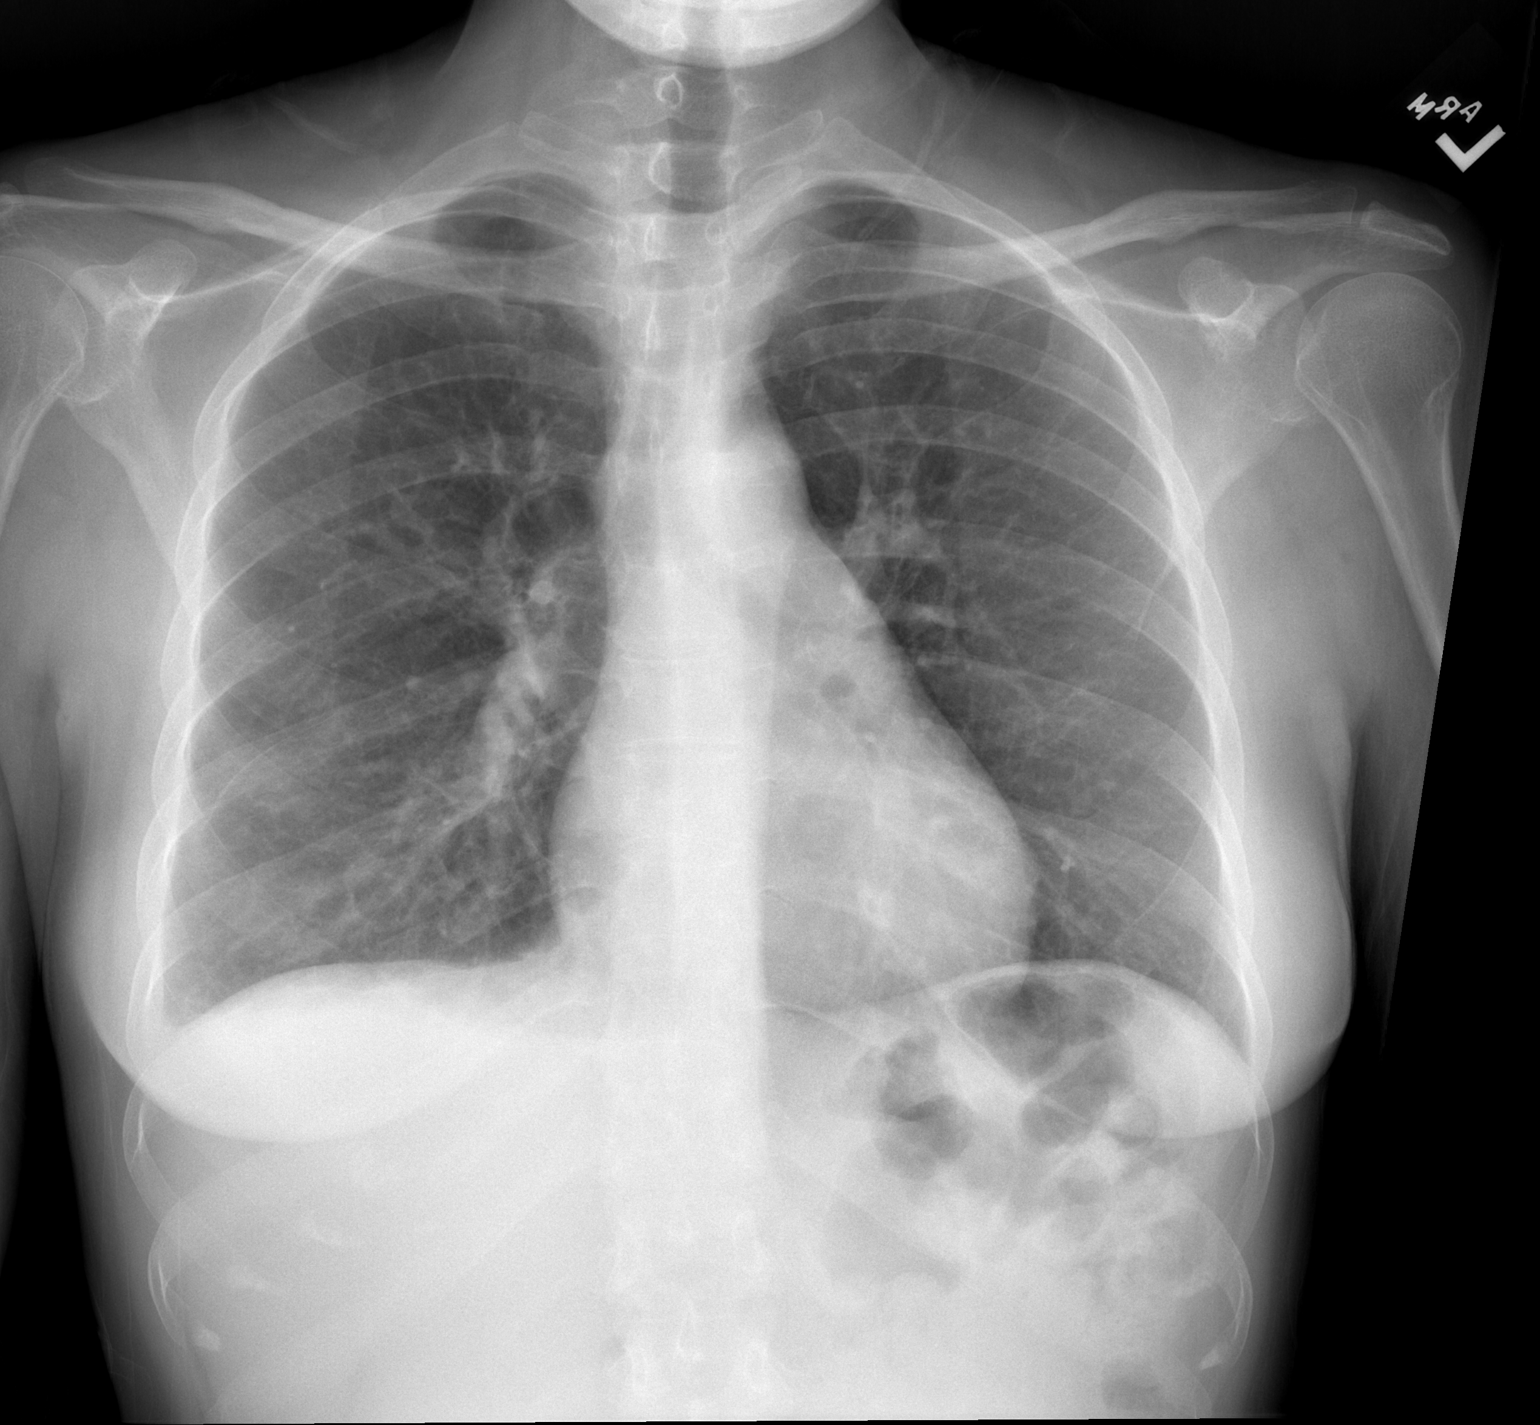

[w chest lat]
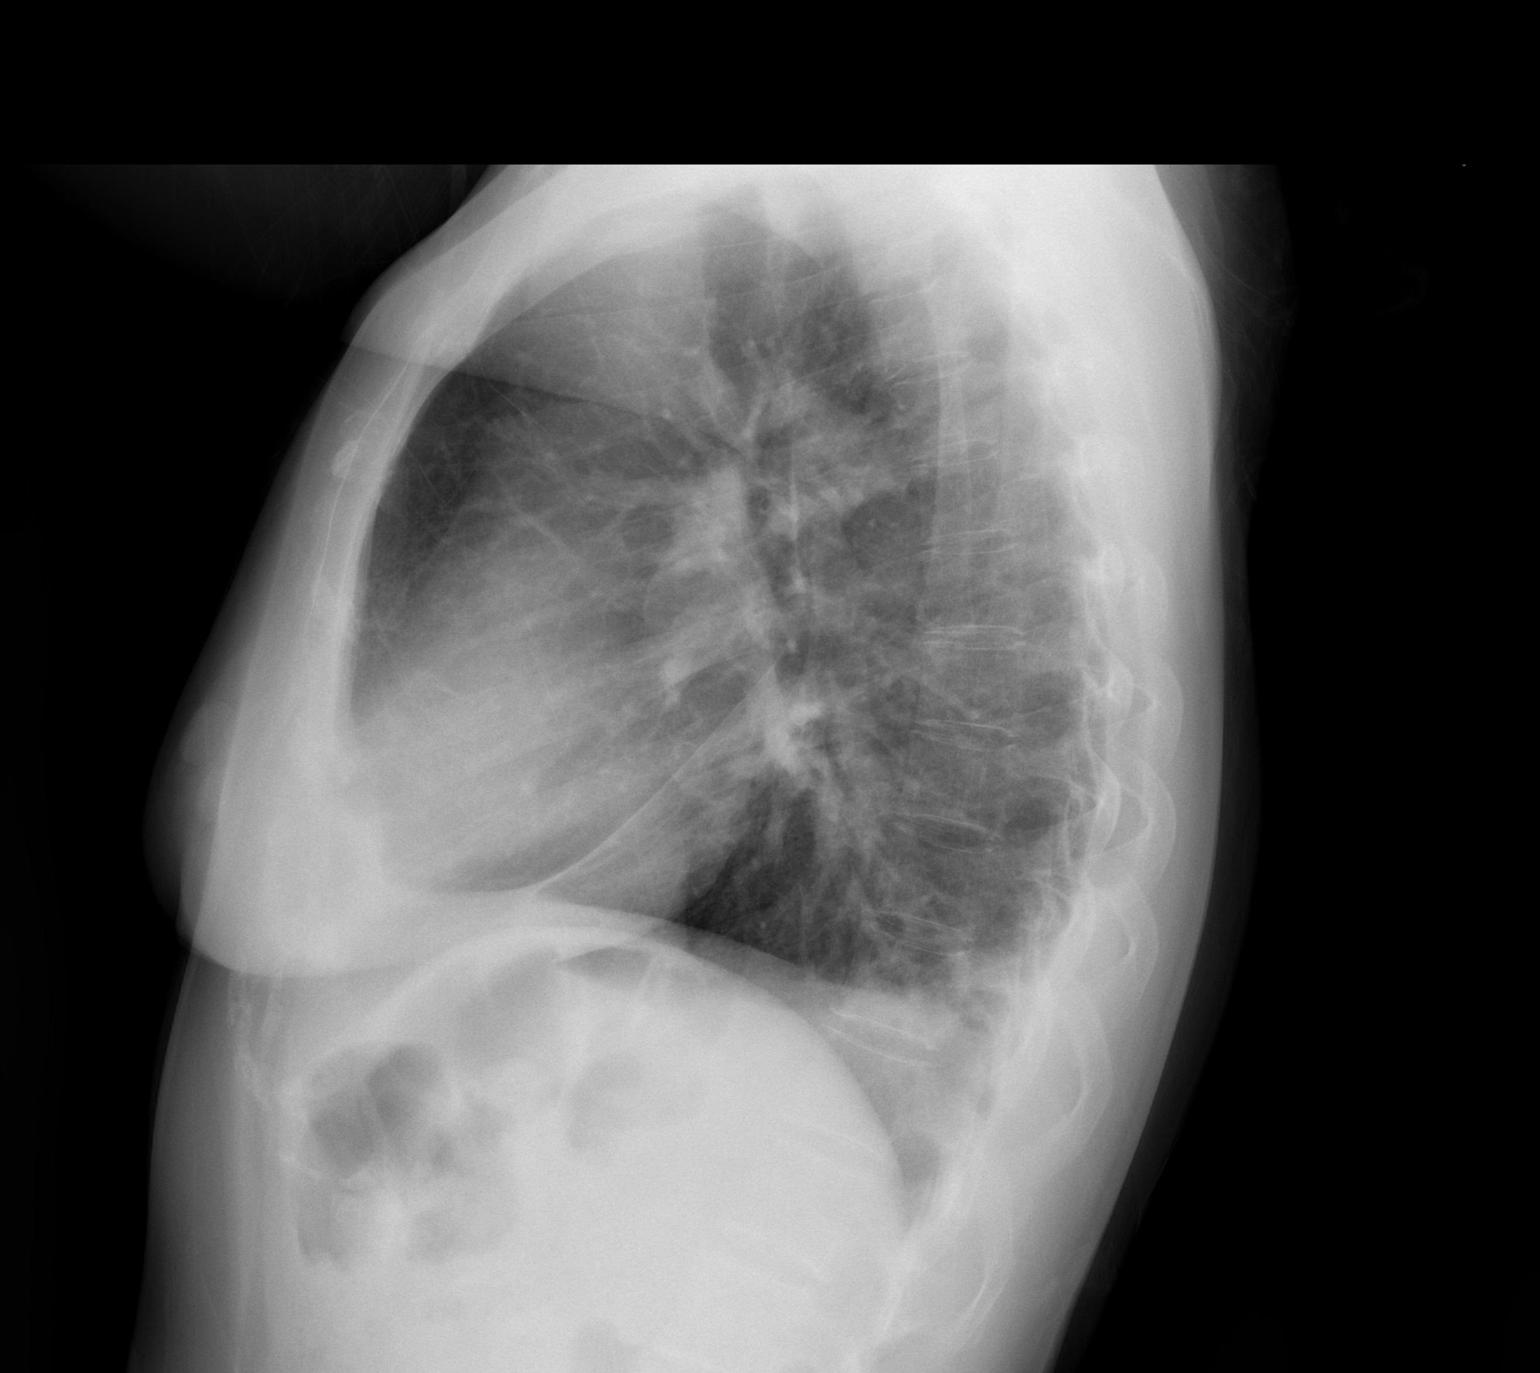

[2 of 2 positions shown; findings below may reference images not displayed]

FINDINGS: Small right pleural effusion with adjacent minimal basilar opacity,
most consistent with compressive atelectasis. The heart size and
mediastinal contours are normal. There is no pulmonary edema. No
left pleural effusion. No pneumothorax. No osseous abnormality.
IMPRESSION: Small right pleural effusion with adjacent basilar opacity, likely
atelectasis; however pneumonia with small parapneumonic effusion is
considered in the setting of fever.

## 2016-11-02 IMAGING — CT CT RENAL STONE PROTOCOL
2 of 4 series · 16 of 46 positions shown, 18 images · non-contrast
Comparison: 04/21/2015

CLINICAL DATA: Right-sided flank pain and hematuria for 1-2 weeks.
Onset of fever and chills 3 days ago.

EXAM:
CT ABDOMEN AND PELVIS WITHOUT CONTRAST
TECHNIQUE: Multidetector CT imaging of the abdomen and pelvis was performed
following the standard protocol without IV contrast.

[Series 2: renal stone < 200 lbs 5.0 b31f · axial · 0.59mm/px · z∈[-258,+42]mm · 13 of 68 slices shown, 15 images]
[im 5/68  soft-tissue]
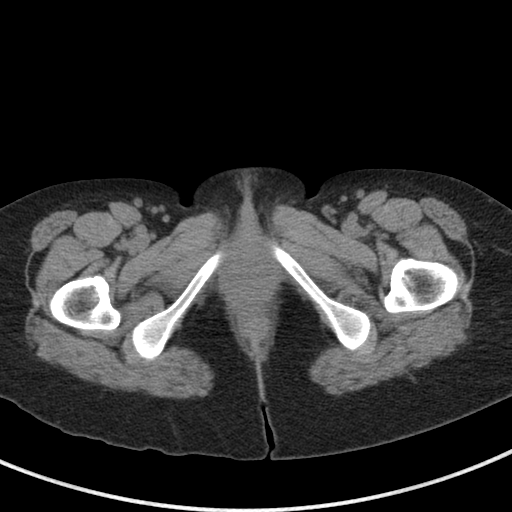
[im 5/68  bone]
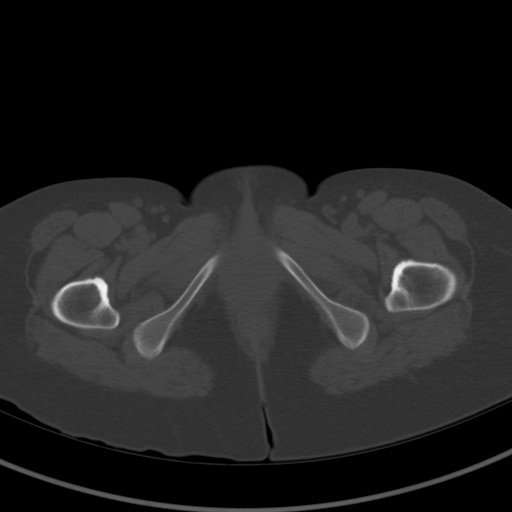
[im 10/68  soft-tissue]
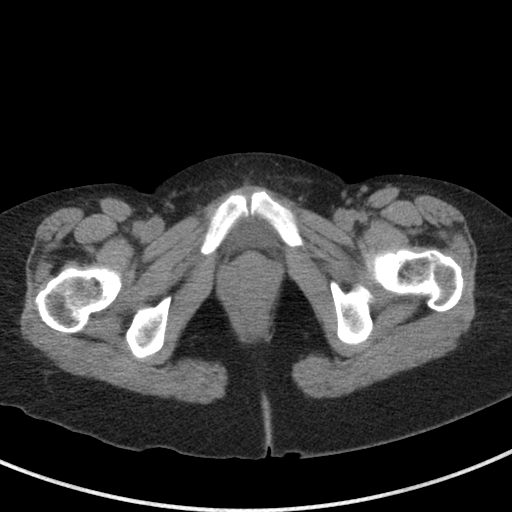
[im 15/68  soft-tissue]
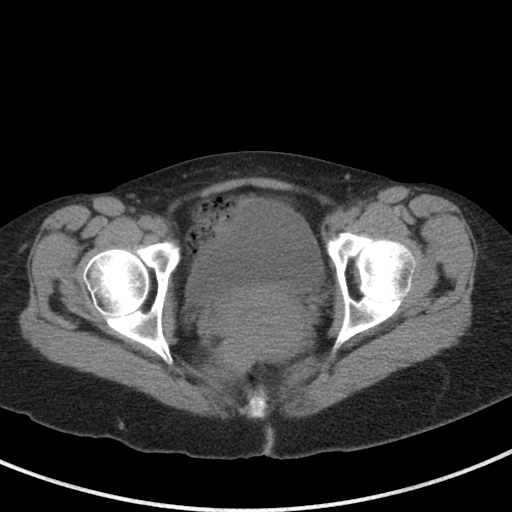
[im 20/68  soft-tissue]
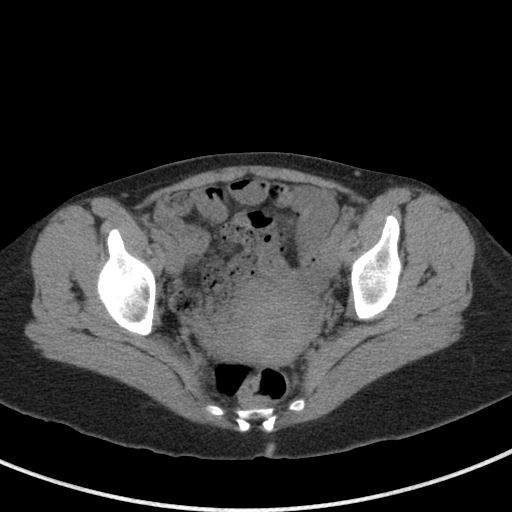
[im 25/68  soft-tissue]
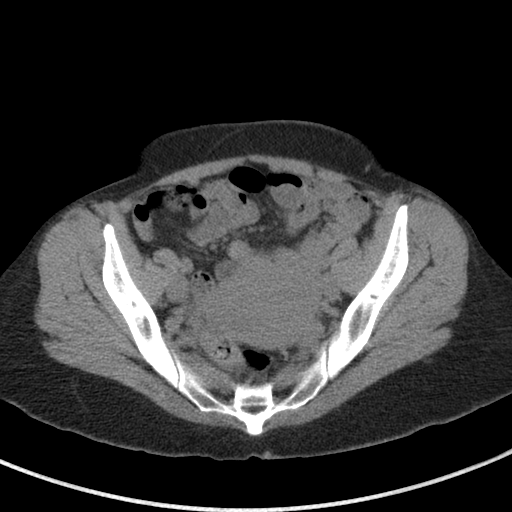
[im 30/68  soft-tissue]
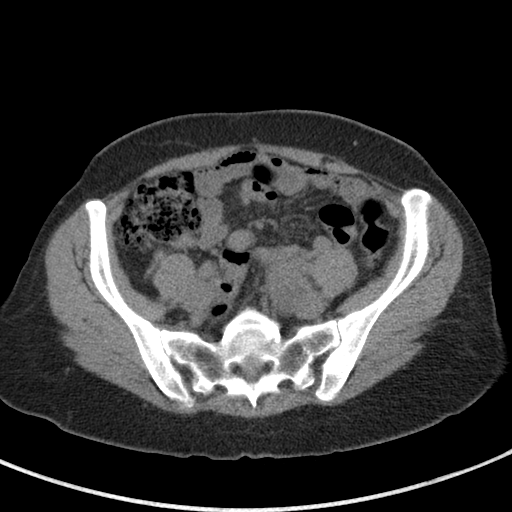
[im 35/68  soft-tissue]
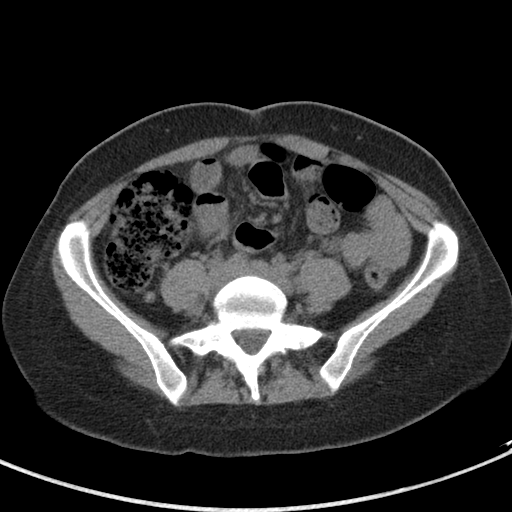
[im 40/68  soft-tissue]
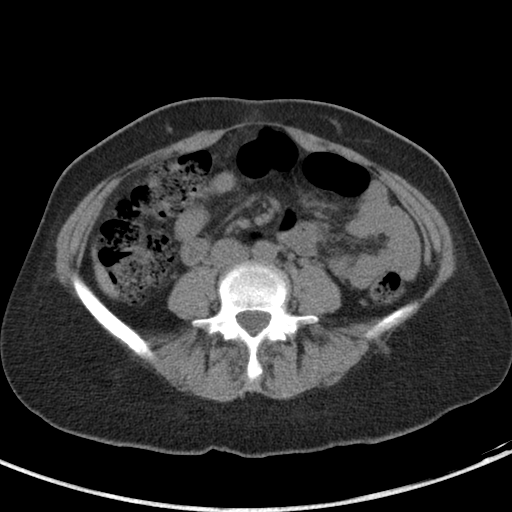
[im 45/68  soft-tissue]
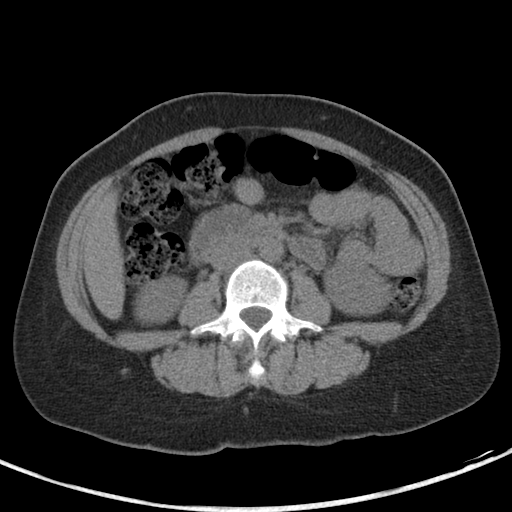
[im 45/68  bone]
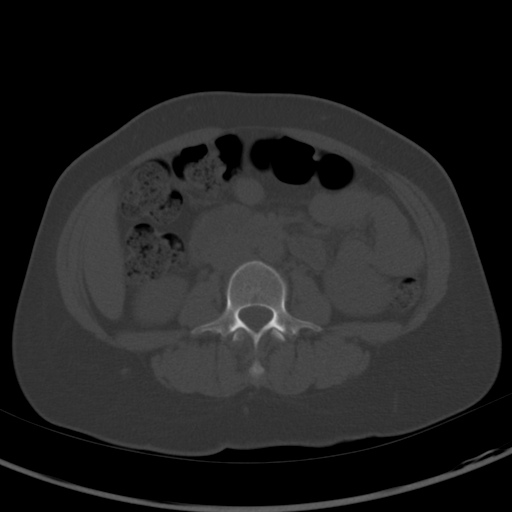
[im 50/68  soft-tissue]
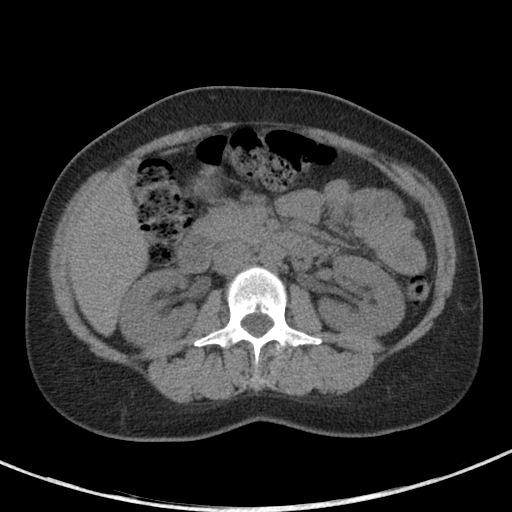
[im 55/68  soft-tissue]
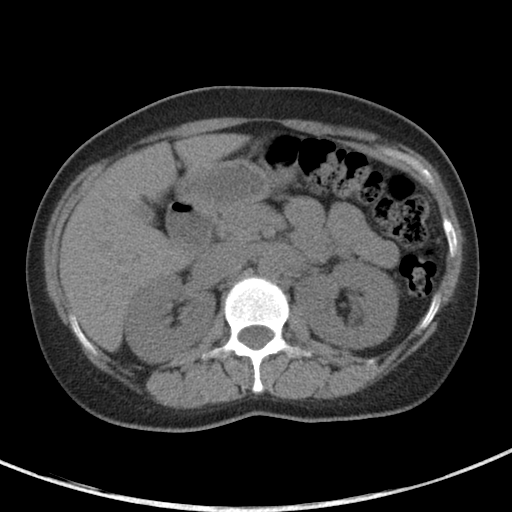
[im 60/68  soft-tissue]
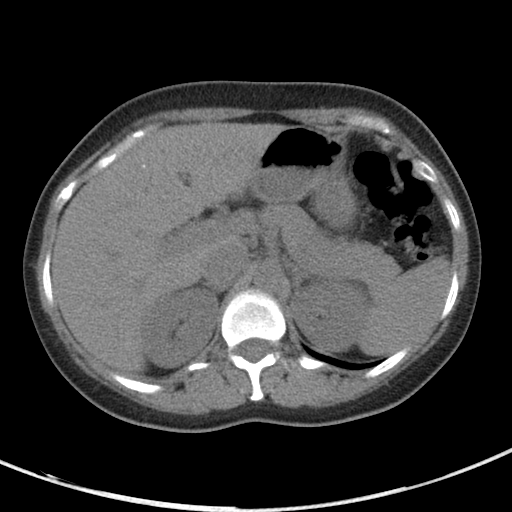
[im 65/68  soft-tissue]
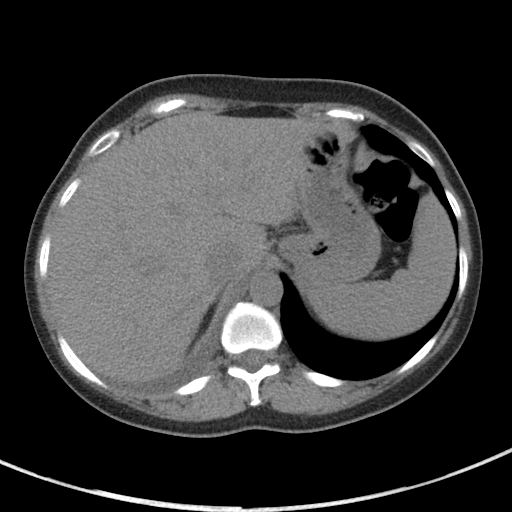

[Series 5: renal stone 3.0 coronal · coronal · 0.60mm/px · 3 of 81 slices shown]
[im 27/81  soft-tissue]
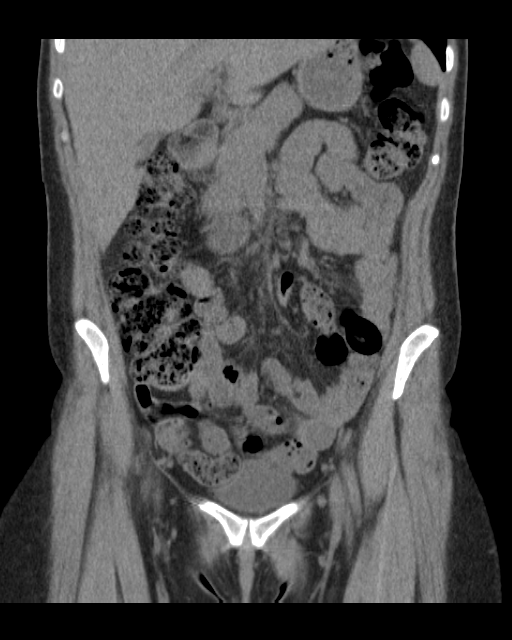
[im 36/81  soft-tissue]
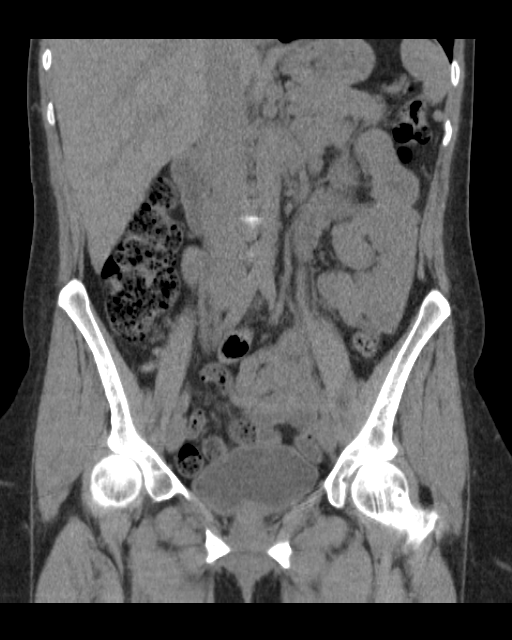
[im 45/81  soft-tissue]
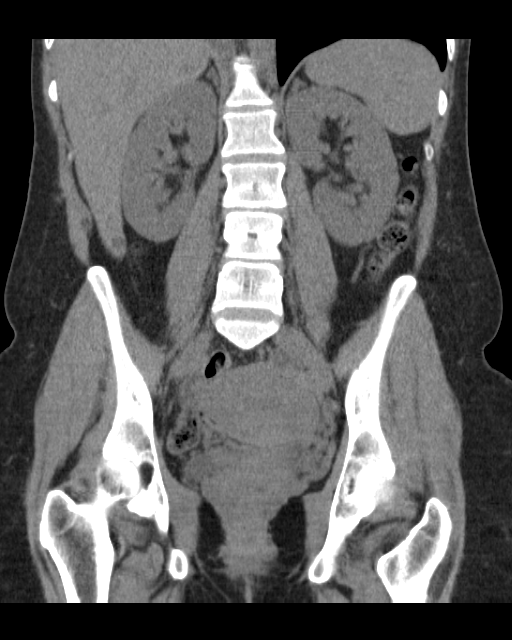

[16 of 46 positions shown; findings below may reference images not displayed]

FINDINGS: Lower chest:  Right pleural effusion is new since prior exam.

Hepatobiliary: No mass visualized on this un-enhanced exam.
Gallbladder is unremarkable.

Pancreas: No mass or inflammatory process identified on this
un-enhanced exam.

Spleen: Within normal limits in size.

Adrenals/Urinary Tract: Several tiny less than 5 mm intrarenal
calculi seen bilaterally on coronal images. No evidence ureteral
calculi or hydronephrosis.

Stomach/Bowel: No evidence of obstruction, inflammatory process, or
abnormal fluid collections. Normal appendix visualized.

Vascular/Lymphatic: No pathologically enlarged lymph nodes. No
evidence of abdominal aortic aneurysm.

Reproductive: No mass or other significant abnormality.

Other: None.

Musculoskeletal:  No suspicious bone lesions identified.
IMPRESSION: Bilateral nonobstructive nephrolithiasis. No evidence of ureteral
calculi or hydronephrosis.

New right pleural effusion noted. Consider chest radiograph for
further evaluation.

## 2017-01-15 DIAGNOSIS — J029 Acute pharyngitis, unspecified: Secondary | ICD-10-CM | POA: Diagnosis not present

## 2017-01-15 DIAGNOSIS — B070 Plantar wart: Secondary | ICD-10-CM | POA: Diagnosis not present

## 2017-01-31 DIAGNOSIS — J029 Acute pharyngitis, unspecified: Secondary | ICD-10-CM | POA: Diagnosis not present

## 2017-06-11 DIAGNOSIS — Z23 Encounter for immunization: Secondary | ICD-10-CM | POA: Diagnosis not present

## 2017-07-08 DIAGNOSIS — B373 Candidiasis of vulva and vagina: Secondary | ICD-10-CM | POA: Diagnosis not present

## 2017-07-15 DIAGNOSIS — B373 Candidiasis of vulva and vagina: Secondary | ICD-10-CM | POA: Diagnosis not present

## 2017-07-15 DIAGNOSIS — R350 Frequency of micturition: Secondary | ICD-10-CM | POA: Diagnosis not present

## 2017-07-27 DIAGNOSIS — B379 Candidiasis, unspecified: Secondary | ICD-10-CM | POA: Diagnosis not present

## 2017-07-27 DIAGNOSIS — J22 Unspecified acute lower respiratory infection: Secondary | ICD-10-CM | POA: Diagnosis not present

## 2017-07-27 DIAGNOSIS — T3695XA Adverse effect of unspecified systemic antibiotic, initial encounter: Secondary | ICD-10-CM | POA: Diagnosis not present

## 2017-08-22 DIAGNOSIS — R300 Dysuria: Secondary | ICD-10-CM | POA: Diagnosis not present

## 2017-08-22 DIAGNOSIS — B070 Plantar wart: Secondary | ICD-10-CM | POA: Diagnosis not present

## 2017-08-22 DIAGNOSIS — N898 Other specified noninflammatory disorders of vagina: Secondary | ICD-10-CM | POA: Diagnosis not present

## 2017-08-30 DIAGNOSIS — Z3490 Encounter for supervision of normal pregnancy, unspecified, unspecified trimester: Secondary | ICD-10-CM | POA: Diagnosis not present

## 2017-08-30 DIAGNOSIS — N911 Secondary amenorrhea: Secondary | ICD-10-CM | POA: Diagnosis not present

## 2017-09-23 DIAGNOSIS — B070 Plantar wart: Secondary | ICD-10-CM | POA: Diagnosis not present

## 2017-10-23 DIAGNOSIS — J029 Acute pharyngitis, unspecified: Secondary | ICD-10-CM | POA: Diagnosis not present

## 2017-10-23 DIAGNOSIS — J0100 Acute maxillary sinusitis, unspecified: Secondary | ICD-10-CM | POA: Diagnosis not present

## 2017-10-23 DIAGNOSIS — R6883 Chills (without fever): Secondary | ICD-10-CM | POA: Diagnosis not present

## 2017-10-23 DIAGNOSIS — R509 Fever, unspecified: Secondary | ICD-10-CM | POA: Diagnosis not present

## 2017-12-17 DIAGNOSIS — R05 Cough: Secondary | ICD-10-CM | POA: Diagnosis not present

## 2018-03-20 DIAGNOSIS — E559 Vitamin D deficiency, unspecified: Secondary | ICD-10-CM | POA: Diagnosis not present

## 2018-03-20 DIAGNOSIS — K5900 Constipation, unspecified: Secondary | ICD-10-CM | POA: Diagnosis not present

## 2018-03-20 DIAGNOSIS — M62830 Muscle spasm of back: Secondary | ICD-10-CM | POA: Diagnosis not present

## 2018-03-24 DIAGNOSIS — K5900 Constipation, unspecified: Secondary | ICD-10-CM | POA: Diagnosis not present

## 2018-03-24 DIAGNOSIS — N898 Other specified noninflammatory disorders of vagina: Secondary | ICD-10-CM | POA: Diagnosis not present

## 2018-05-11 ENCOUNTER — Other Ambulatory Visit (HOSPITAL_COMMUNITY)
Admission: RE | Admit: 2018-05-11 | Discharge: 2018-05-11 | Disposition: A | Discharge status: Home / Self Care | Payer: BLUE CROSS/BLUE SHIELD | Source: Ambulatory Visit | Attending: Family Medicine | Admitting: Family Medicine

## 2018-05-11 ENCOUNTER — Other Ambulatory Visit: Payer: Self-pay | Admitting: Family Medicine

## 2018-05-11 DIAGNOSIS — Z124 Encounter for screening for malignant neoplasm of cervix: Secondary | ICD-10-CM | POA: Diagnosis not present

## 2018-05-11 DIAGNOSIS — Z0000 Encounter for general adult medical examination without abnormal findings: Secondary | ICD-10-CM | POA: Diagnosis not present

## 2018-05-14 LAB — CYTOLOGY - PAP
Chlamydia: NEGATIVE
Diagnosis: UNDETERMINED — AB
HPV: NOT DETECTED
Neisseria Gonorrhea: NEGATIVE
TRICH (WINDOWPATH): NEGATIVE

## 2018-05-22 DIAGNOSIS — J329 Chronic sinusitis, unspecified: Secondary | ICD-10-CM | POA: Diagnosis not present

## 2018-05-24 DIAGNOSIS — K219 Gastro-esophageal reflux disease without esophagitis: Secondary | ICD-10-CM | POA: Diagnosis not present

## 2018-05-26 DIAGNOSIS — R87610 Atypical squamous cells of undetermined significance on cytologic smear of cervix (ASC-US): Secondary | ICD-10-CM | POA: Diagnosis not present

## 2018-05-26 DIAGNOSIS — K219 Gastro-esophageal reflux disease without esophagitis: Secondary | ICD-10-CM | POA: Diagnosis not present

## 2018-05-26 DIAGNOSIS — F411 Generalized anxiety disorder: Secondary | ICD-10-CM | POA: Diagnosis not present

## 2018-05-29 DIAGNOSIS — B379 Candidiasis, unspecified: Secondary | ICD-10-CM | POA: Diagnosis not present

## 2018-06-03 DIAGNOSIS — M549 Dorsalgia, unspecified: Secondary | ICD-10-CM | POA: Diagnosis not present

## 2018-06-03 DIAGNOSIS — R740 Nonspecific elevation of levels of transaminase and lactic acid dehydrogenase [LDH]: Secondary | ICD-10-CM | POA: Diagnosis not present

## 2018-06-03 DIAGNOSIS — R438 Other disturbances of smell and taste: Secondary | ICD-10-CM | POA: Diagnosis not present

## 2018-06-03 DIAGNOSIS — R1013 Epigastric pain: Secondary | ICD-10-CM | POA: Diagnosis not present

## 2018-06-24 DIAGNOSIS — K589 Irritable bowel syndrome without diarrhea: Secondary | ICD-10-CM | POA: Diagnosis not present

## 2018-06-24 DIAGNOSIS — R1013 Epigastric pain: Secondary | ICD-10-CM | POA: Diagnosis not present

## 2018-06-25 DIAGNOSIS — K589 Irritable bowel syndrome without diarrhea: Secondary | ICD-10-CM | POA: Diagnosis not present

## 2018-06-29 DIAGNOSIS — Z23 Encounter for immunization: Secondary | ICD-10-CM | POA: Diagnosis not present

## 2018-08-12 ENCOUNTER — Other Ambulatory Visit: Payer: Self-pay | Admitting: Gastroenterology

## 2018-08-12 DIAGNOSIS — R1084 Generalized abdominal pain: Secondary | ICD-10-CM

## 2018-08-31 ENCOUNTER — Other Ambulatory Visit: Payer: Self-pay | Admitting: Gastroenterology

## 2018-08-31 ENCOUNTER — Ambulatory Visit
Admission: RE | Admit: 2018-08-31 | Discharge: 2018-08-31 | Disposition: A | Discharge status: Home / Self Care | Payer: BLUE CROSS/BLUE SHIELD | Source: Ambulatory Visit | Attending: Gastroenterology | Admitting: Gastroenterology

## 2018-08-31 DIAGNOSIS — R1084 Generalized abdominal pain: Secondary | ICD-10-CM | POA: Diagnosis not present

## 2018-09-04 ENCOUNTER — Encounter

## 2018-09-07 ENCOUNTER — Other Ambulatory Visit: Payer: BLUE CROSS/BLUE SHIELD

## 2018-09-07 ENCOUNTER — Encounter: Payer: Self-pay | Admitting: Family Medicine

## 2018-09-07 ENCOUNTER — Ambulatory Visit: Payer: BLUE CROSS/BLUE SHIELD | Admitting: Family Medicine

## 2018-09-07 ENCOUNTER — Ambulatory Visit
Admission: RE | Admit: 2018-09-07 | Discharge: 2018-09-07 | Disposition: A | Discharge status: Home / Self Care | Payer: BLUE CROSS/BLUE SHIELD | Source: Ambulatory Visit | Attending: Gastroenterology | Admitting: Gastroenterology

## 2018-09-07 VITALS — BP 108/72 | HR 98 | Temp 98.6°F | Ht 61.0 in | Wt 149.2 lb

## 2018-09-07 DIAGNOSIS — B373 Candidiasis of vulva and vagina: Secondary | ICD-10-CM

## 2018-09-07 DIAGNOSIS — R1084 Generalized abdominal pain: Secondary | ICD-10-CM

## 2018-09-07 DIAGNOSIS — R739 Hyperglycemia, unspecified: Secondary | ICD-10-CM | POA: Diagnosis not present

## 2018-09-07 DIAGNOSIS — E559 Vitamin D deficiency, unspecified: Secondary | ICD-10-CM | POA: Diagnosis not present

## 2018-09-07 DIAGNOSIS — R5383 Other fatigue: Secondary | ICD-10-CM | POA: Diagnosis not present

## 2018-09-07 DIAGNOSIS — R1013 Epigastric pain: Secondary | ICD-10-CM

## 2018-09-07 DIAGNOSIS — K219 Gastro-esophageal reflux disease without esophagitis: Secondary | ICD-10-CM

## 2018-09-07 DIAGNOSIS — B3731 Acute candidiasis of vulva and vagina: Secondary | ICD-10-CM

## 2018-09-07 DIAGNOSIS — Z13220 Encounter for screening for lipoid disorders: Secondary | ICD-10-CM

## 2018-09-07 LAB — COMPREHENSIVE METABOLIC PANEL
ALT: 13 U/L (ref 0–35)
AST: 9 U/L (ref 0–37)
Albumin: 4.2 g/dL (ref 3.5–5.2)
Alkaline Phosphatase: 59 U/L (ref 39–117)
BUN: 12 mg/dL (ref 6–23)
CO2: 31 mEq/L (ref 19–32)
Calcium: 9.4 mg/dL (ref 8.4–10.5)
Chloride: 101 mEq/L (ref 96–112)
Creatinine, Ser: 0.53 mg/dL (ref 0.40–1.20)
GFR: 134.18 mL/min (ref >60.00)
Glucose, Bld: 103 mg/dL — ABNORMAL HIGH (ref 70–99)
Potassium: 3.7 mEq/L (ref 3.5–5.1)
Sodium: 138 mEq/L (ref 135–145)
Total Bilirubin: 0.4 mg/dL (ref 0.2–1.2)
Total Protein: 7 g/dL (ref 6.0–8.3)

## 2018-09-07 LAB — CBC WITH DIFFERENTIAL/PLATELET
Basophils Absolute: 0 10*3/uL (ref 0.0–0.1)
Basophils Relative: 0.4 % (ref 0.0–3.0)
Eosinophils Absolute: 0.1 10*3/uL (ref 0.0–0.7)
Eosinophils Relative: 2.4 % (ref 0.0–5.0)
HCT: 41.7 % (ref 36.0–46.0)
Hemoglobin: 14.1 g/dL (ref 12.0–15.0)
Lymphocytes Relative: 28.1 % (ref 12.0–46.0)
Lymphs Abs: 1.7 10*3/uL (ref 0.7–4.0)
MCHC: 33.7 g/dL (ref 30.0–36.0)
MCV: 88.5 fl (ref 78.0–100.0)
Monocytes Absolute: 0.4 10*3/uL (ref 0.1–1.0)
Monocytes Relative: 5.7 % (ref 3.0–12.0)
Neutro Abs: 3.9 10*3/uL (ref 1.4–7.7)
Neutrophils Relative %: 63.4 % (ref 43.0–77.0)
Platelets: 284 10*3/uL (ref 150.0–400.0)
RBC: 4.72 Mil/uL (ref 3.87–5.11)
RDW: 13 % (ref 11.5–15.5)
WBC: 6.1 10*3/uL (ref 4.0–10.5)

## 2018-09-07 LAB — LIPID PANEL
Cholesterol: 172 mg/dL (ref 0–200)
HDL: 64.1 mg/dL (ref >39.00)
LDL Cholesterol: 89 mg/dL (ref 0–99)
NonHDL: 108.12
Total CHOL/HDL Ratio: 3
Triglycerides: 95 mg/dL (ref 0.0–149.0)
VLDL: 19 mg/dL (ref 0.0–40.0)

## 2018-09-07 LAB — TSH: TSH: 0.47 u[IU]/mL (ref 0.35–4.50)

## 2018-09-07 LAB — VITAMIN D 25 HYDROXY (VIT D DEFICIENCY, FRACTURES): VITD: 28.55 ng/mL — ABNORMAL LOW (ref 30.00–100.00)

## 2018-09-07 LAB — VITAMIN B12: Vitamin B-12: 155 pg/mL — ABNORMAL LOW (ref 211–911)

## 2018-09-07 LAB — HEMOGLOBIN A1C: Hgb A1c MFr Bld: 5.8 % (ref 4.6–6.5)

## 2018-09-07 MED ORDER — FLUCONAZOLE 100 MG PO TABS: 100.0000 mg | ORAL_TABLET | Freq: Every day | ORAL | 0 refills | Status: AC

## 2018-09-07 NOTE — Progress Notes (Signed)
Krista Peterson is a 42 y.[facility]. [facility]   Patient Care Team: Krista RimaWallace, Krista Eberly, DO as PCP - General (Family Medicine)   History of Present Illness:   Krista Peterson acting as scribe for Dr. Helane RimaErica Tara Peterson.   HPI: Patient in office to establish care. [facility] but was never able to get appointment at office. She was recently put on antibiotic for dental procedure and is having hard time with a yeast infection. Would like something sent in for this.   Acid reflux. Has been a recent problem. She is treated by GI. Has had barium swallow test done today. Told that it was normal.  Divorced. From [place]. Moved here since her son's father lives here, along with other family. Son is at [employer]. Only child. Patient works at [employer] in [place]. Not currently dating. Co-parenting well with ex-husband.   Aug 17, chipped front tooth. Pulled by dentist. She did not understand that this was going to happen and it caused severe emotional distress. [facility]. Will have finished in a few months.   Health Maintenance Due  Topic Date Due  . HIV Screening  04/07/1991  . TETANUS/TDAP  04/07/1995  . INFLUENZA VACCINE  04/16/2018   Depression screen PHQ 2/9 09/07/2018  Decreased Interest 0  Down, Depressed, Hopeless 2  PHQ - 2 Score 2  Altered sleeping 0  Tired, decreased energy 1  Change in appetite 0  Feeling bad or failure about yourself  0  Trouble concentrating 0  Moving slowly or fidgety/restless 0  Suicidal thoughts 0  PHQ-9 Score 3  Difficult doing work/chores Not difficult at all   PMHx, SurgHx, SocialHx, Medications, and Allergies were reviewed in the Visit Navigator and updated as appropriate.   Past Medical History:  Diagnosis Date  . Kidney stones      Past Surgical History:  Procedure Laterality Date  . LITHOTRIPSY     Family History  Problem Relation Age of Onset  . Hypertension Father     Social History   Tobacco Use  . Smoking status: Never Smoker  . Smokeless tobacco: Never Used  Substance Use Topics  . Alcohol use: Yes    Comment: rarely  . Drug use: No   Current Medications and Allergies:   .  Azelastine-Fluticasone 137-50 MCG/ACT SUSP, Place 1 spray into the nose daily., Disp: , Rfl:  .  cefpodoxime (VANTIN) 100 MG tablet, Take 1 tablet (100 mg total) by mouth 2 (two) times daily., Disp: 14 tablet, Rfl: 0 .  omeprazole (PRILOSEC) 20 MG capsule, Take 20 mg by mouth daily., Disp: , Rfl:   No Active Allergies   Review of Systems:   Pertinent items are noted in the HPI. Otherwise, a complete ROS is negative.  Vitals:   Vitals:   09/07/18 1325  BP: 108/72  Pulse: 98  Temp: 98.6 F (37 C)  TempSrc: Oral  SpO2: 98%  Weight: 149 lb 3.2 oz (67.7 kg)  Height: 5\' 1"  (1.549 m)     Body mass index is 28.19 kg/m.  Physical Exam:   Physical Exam Vitals signs and nursing note reviewed.  HENT:     Head: Normocephalic and atraumatic.  Eyes:     Pupils: Pupils are equal, round, and reactive to light.  Neck:     Musculoskeletal: Normal range of motion and neck supple.  Cardiovascular:     Rate and  Rhythm: Normal rate and regular rhythm.     Heart sounds: Normal heart sounds.  Pulmonary:     Effort: Pulmonary effort is normal.  Abdominal:     Palpations: Abdomen is soft.  Skin:    General: Skin is warm.  Psychiatric:        Behavior: Behavior normal.    Assessment and Plan:   Krista Peterson was seen today for establish care.  Diagnoses and all orders for this visit:  Fatigue, unspecified type -     CBC with Differential/Platelet -     Comprehensive metabolic panel -     Vitamin B12 -     TSH  Hyperglycemia -     Hemoglobin A1c  Gastroesophageal reflux disease without esophagitis  Yeast vaginitis -     fluconazole (DIFLUCAN) 100 MG tablet; Take 1 tablet (100 mg total) by mouth daily for 3 days.  Vitamin D deficiency -     VITAMIN D 25  Hydroxy (Vit-D Deficiency, Fractures)  Screening for lipid disorders -     Lipid panel  Dyspepsia   . Orders and follow up as documented in EpicCare, reviewed diet, exercise and weight control, cardiovascular risk and specific lipid/LDL goals reviewed, reviewed medications and side effects in detail.  . Reviewed expectations re: course of current medical issues. . Outlined signs and symptoms indicating need for more acute intervention. . Patient verbalized understanding and all questions were answered. . Patient received an After Visit Summary.  Peterson served as Neurosurgeonscribe during this visit. History, Physical, and Plan performed by medical provider. The above documentation has been reviewed and is accurate and complete. Krista RimaErica Eivan Gallina, D.O.  Krista RimaErica Keriann Rankin, DO [place] [place] 09/08/2018  Records requested if needed. Time spent with the patient: 30 minutes, of which >50% was spent in obtaining information about her symptoms, reviewing her previous labs, evaluations, and treatments, counseling her about her condition (please see the discussed topics above), and developing a plan to further investigate it; she had a number of questions which I addressed.

## 2018-09-08 ENCOUNTER — Encounter: Payer: Self-pay | Admitting: Family Medicine

## 2018-09-08 ENCOUNTER — Telehealth: Payer: Self-pay | Admitting: Family Medicine

## 2018-09-08 NOTE — Telephone Encounter (Signed)
Called patient will address at follow up.

## 2018-09-08 NOTE — Telephone Encounter (Signed)
See note  Copied from CRM [id number]. Topic: General - Other >> Sep 08, 2018 11:30 AM Tamela OddiHarris, Brenda J wrote: Reason for CRM: Patient called to request that she repeat her sugar test.  Patient stated that she had to much sugar in her system which cause her blood test to run high.  Please call patient to let her know if the orders can be placed for her to repeat her labs.  CB# [phone]

## 2018-09-10 NOTE — Progress Notes (Signed)
Krista Peterson is a 42 y.o. female is here for follow up.  History of Present Illness:   HPI: Reviewed recent labs today.   Results for orders placed or performed in visit on 09/07/18  CBC with Differential/Platelet  Result Value Ref Range   WBC 6.1 4.0 - 10.5 K/uL   RBC 4.72 3.87 - 5.11 Mil/uL   Hemoglobin 14.1 12.0 - 15.0 g/dL   HCT 16.141.7 09.636.0 - 04.546.0 %   MCV 88.5 78.0 - 100.0 fl   MCHC 33.7 30.0 - 36.0 g/dL   RDW 40.913.0 81.111.5 - 91.415.5 %   Platelets 284.0 150.0 - 400.0 K/uL   Neutrophils Relative % 63.4 43.0 - 77.0 %   Lymphocytes Relative 28.1 12.0 - 46.0 %   Monocytes Relative 5.7 3.0 - 12.0 %   Eosinophils Relative 2.4 0.0 - 5.0 %   Basophils Relative 0.4 0.0 - 3.0 %   Neutro Abs 3.9 1.4 - 7.7 K/uL   Lymphs Abs 1.7 0.7 - 4.0 K/uL   Monocytes Absolute 0.4 0.1 - 1.0 K/uL   Eosinophils Absolute 0.1 0.0 - 0.7 K/uL   Basophils Absolute 0.0 0.0 - 0.1 K/uL  Comprehensive metabolic panel  Result Value Ref Range   Sodium 138 135 - 145 mEq/L   Potassium 3.7 3.5 - 5.1 mEq/L   Chloride 101 96 - 112 mEq/L   CO2 31 19 - 32 mEq/L   Glucose, Bld 103 (H) 70 - 99 mg/dL   BUN 12 6 - 23 mg/dL   Creatinine, Ser 7.820.53 0.40 - 1.20 mg/dL   Total Bilirubin 0.4 0.2 - 1.2 mg/dL   Alkaline Phosphatase 59 39 - 117 U/L   AST 9 0 - 37 U/L   ALT 13 0 - 35 U/L   Total Protein 7.0 6.0 - 8.3 g/dL   Albumin 4.2 3.5 - 5.2 g/dL   Calcium 9.4 8.4 - 95.610.5 mg/dL   GFR 213.08134.18 >65.78>60.00 mL/min  Lipid panel  Result Value Ref Range   Cholesterol 172 0 - 200 mg/dL   Triglycerides 46.995.0 0.0 - 149.0 mg/dL   HDL 62.9564.10 >28.41>39.00 mg/dL   VLDL 32.419.0 0.0 - 40.140.0 mg/dL   LDL Cholesterol 89 0 - 99 mg/dL   Total CHOL/HDL Ratio 3    NonHDL 108.12   Vitamin B12  Result Value Ref Range   Vitamin B-12 155 (L) 211 - 911 pg/mL  VITAMIN D 25 Hydroxy (Vit-D Deficiency, Fractures)  Result Value Ref Range   VITD 28.55 (L) 30.00 - 100.00 ng/mL  TSH  Result Value Ref Range   TSH 0.47 0.35 - 4.50 uIU/mL  Hemoglobin A1c  Result  Value Ref Range   Hgb A1c MFr Bld 5.8 4.6 - 6.5 %   Low B12 - will start monthly injections plus daily B12 at 1000 mcg.  Low vitamin D - has already started supplements and raised 10 points in < 2 months. Will continue. Insulin resistance - she has already started working on diet changes, decreasing sugar in her diet.   Health Maintenance Due  Topic Date Due  . HIV Screening  04/07/1991  . TETANUS/TDAP  04/07/1995   Depression screen PHQ 2/9 09/07/2018  Decreased Interest 0  Down, Depressed, Hopeless 2  PHQ - 2 Score 2  Altered sleeping 0  Tired, decreased energy 1  Change in appetite 0  Feeling bad or failure about yourself  0  Trouble concentrating 0  Moving slowly or fidgety/restless 0  Suicidal thoughts 0  PHQ-9 Score  3  Difficult doing work/chores Not difficult at all   PMHx, SurgHx, SocialHx, FamHx, Medications, and Allergies were reviewed in the Visit Navigator and updated as appropriate.  There are no active problems to display for this patient.  Social History   Tobacco Use  . Smoking status: Never Smoker  . Smokeless tobacco: Never Used  Substance Use Topics  . Alcohol use: Yes    Comment: rarely  . Drug use: No   Current Medications and Allergies:   .  Azelastine-Fluticasone 137-50 MCG/ACT SUSP, Place 1 spray into the nose daily., Disp: , Rfl:  .  cefpodoxime (VANTIN) 100 MG tablet, Take 1 tablet (100 mg total) by mouth 2 (two) times daily., Disp: 14 tablet, Rfl: 0 .  omeprazole (PRILOSEC) 20 MG capsule, Take 20 mg by mouth daily., Disp: , Rfl:   No Active Allergies   Review of Systems   Pertinent items are noted in the HPI. Otherwise, a complete ROS is negative.  Vitals:   Vitals:   09/11/18 1201  BP: 110/74  Pulse: 84  Temp: 99 F (37.2 C)  TempSrc: Oral  SpO2: 99%  Weight: 148 lb (67.1 kg)  Height: 5\' 1"  (1.549 m)     Body mass index is 27.96 kg/m.  Physical Exam:   Physical Exam Constitutional:      General: She is not in acute  distress.    Appearance: She is well-developed.  HENT:     Head: Normocephalic and atraumatic.     Right Ear: External ear normal.     Left Ear: External ear normal.     Mouth/Throat:     Pharynx: No oropharyngeal exudate.  Eyes:     Conjunctiva/sclera: Conjunctivae normal.     Pupils: Pupils are equal, round, and reactive to light.  Neck:     Musculoskeletal: Normal range of motion and neck supple.     Thyroid: No thyromegaly.     Vascular: No JVD.  Cardiovascular:     Rate and Rhythm: Normal rate and regular rhythm.     Heart sounds: No murmur. No friction rub. No gallop.   Pulmonary:     Effort: Pulmonary effort is normal.     Breath sounds: No wheezing.  Chest:     Chest wall: No tenderness.  Abdominal:     General: Bowel sounds are normal.     Palpations: Abdomen is soft. There is no mass.     Tenderness: There is no abdominal tenderness. There is no guarding or rebound.  Musculoskeletal: Normal range of motion.  Lymphadenopathy:     Cervical: No cervical adenopathy.  Skin:    Findings: No rash.  Neurological:     Mental Status: She is alert and oriented to person, place, and time.     Deep Tendon Reflexes: Reflexes are normal and symmetric.  Psychiatric:        Behavior: Behavior normal.        Thought Content: Thought content normal.        Judgment: Judgment normal.    Assessment and Plan:   Krista Peterson was seen today for follow-up.  Diagnoses and all orders for this visit:  Vitamin D deficiency Comments: Supplementing with 2000 IU daily.  Insulin resistance Comments: Patient will work on plant-based, low carbohydrate diet.   B12 deficiency Comments: Will start monthly injections.   Presence of tooth-root and mandibular implants Comments: Patient has had implant x 8 days. Hates it and will likely have it removed.  Gastroesophageal reflux disease  without esophagitis Comments: Improved with PPI. Has been evaluated by GI.   Marland Kitchen Orders and follow up as  documented in EpicCare, reviewed diet, exercise and weight control, cardiovascular risk and specific lipid/LDL goals reviewed, reviewed medications and side effects in detail.  . Reviewed expectations re: course of current medical issues. . Outlined signs and symptoms indicating need for more acute intervention. . Patient verbalized understanding and all questions were answered. . Patient received an After Visit Summary.  Helane Rima, DO [place], [facility] 09/12/2018

## 2018-09-11 ENCOUNTER — Other Ambulatory Visit: Payer: BLUE CROSS/BLUE SHIELD

## 2018-09-11 ENCOUNTER — Ambulatory Visit: Payer: BLUE CROSS/BLUE SHIELD | Admitting: Family Medicine

## 2018-09-11 VITALS — BP 110/74 | HR 84 | Temp 99.0°F | Ht 61.0 in | Wt 148.0 lb

## 2018-09-11 DIAGNOSIS — E8881 Metabolic syndrome: Secondary | ICD-10-CM

## 2018-09-11 DIAGNOSIS — Z965 Presence of tooth-root and mandibular implants: Secondary | ICD-10-CM

## 2018-09-11 DIAGNOSIS — E559 Vitamin D deficiency, unspecified: Secondary | ICD-10-CM

## 2018-09-11 DIAGNOSIS — E538 Deficiency of other specified B group vitamins: Secondary | ICD-10-CM

## 2018-09-11 DIAGNOSIS — E88819 Insulin resistance, unspecified: Secondary | ICD-10-CM

## 2018-09-11 DIAGNOSIS — K219 Gastro-esophageal reflux disease without esophagitis: Secondary | ICD-10-CM

## 2018-09-12 ENCOUNTER — Encounter: Payer: Self-pay | Admitting: Family Medicine

## 2018-09-12 NOTE — Patient Instructions (Signed)
Your B12 level was very low.  We normally replace B12 initially with IM injections, which can be changed to sublingual supplements after 3 months if you prefer.  Please make an appt for a "nurse visit" to get your first injection.  While you are waiting, you can start a sublingual B12 supplement  (dissolves under the tongue), but you need to get at least 3 injections to be sure we have corrected the deficit.

## 2018-09-14 ENCOUNTER — Other Ambulatory Visit: Payer: BLUE CROSS/BLUE SHIELD

## 2018-09-24 ENCOUNTER — Telehealth: Payer: Self-pay | Admitting: Family Medicine

## 2018-09-24 DIAGNOSIS — R519 Headache, unspecified: Secondary | ICD-10-CM

## 2018-09-24 DIAGNOSIS — R51 Headache: Principal | ICD-10-CM

## 2018-09-24 NOTE — Telephone Encounter (Signed)
Copied from CRM [id number]. Topic: Referral - Request for Referral >> Sep 24, 2018  1:52 PM Jens Som A wrote: Has patient seen PCP for this complaint? No  *If NO, is insurance requiring patient see PCP for this issue before PCP can refer them?NO Referral for which specialty: Allegerist  Preferred provider/office: None Reason for referral: Have head neck pain had titanium implant having headaches after that.

## 2018-09-24 NOTE — Telephone Encounter (Signed)
See note

## 2018-09-24 NOTE — Telephone Encounter (Signed)
Ok to place referral.

## 2018-09-25 NOTE — Telephone Encounter (Signed)
Okay referral.

## 2018-09-25 NOTE — Telephone Encounter (Signed)
Referral has been placed Pt notified.

## 2018-10-14 ENCOUNTER — Ambulatory Visit: Payer: BLUE CROSS/BLUE SHIELD

## 2018-10-15 ENCOUNTER — Ambulatory Visit (INDEPENDENT_AMBULATORY_CARE_PROVIDER_SITE_OTHER): Payer: BLUE CROSS/BLUE SHIELD

## 2018-10-15 DIAGNOSIS — E538 Deficiency of other specified B group vitamins: Secondary | ICD-10-CM

## 2018-10-15 MED ORDER — CYANOCOBALAMIN 1000 MCG/ML IJ SOLN
1000.0000 ug | Freq: Once | INTRAMUSCULAR | Status: AC
  Administered 2018-10-15: 1000 ug via INTRAMUSCULAR

## 2018-10-15 NOTE — Progress Notes (Signed)
Per orders of Dr. Earlene Plater, injection of  Injection of B12 given in right arm by Sherrin Daisy. Patient tolerated injection well.

## 2018-10-15 NOTE — Patient Instructions (Signed)
Health Maintenance Due  Topic Date Due  . HIV Screening  04/07/1991  . TETANUS/TDAP  04/07/1995    Depression screen PHQ 2/9 09/07/2018  Decreased Interest 0  Down, Depressed, Hopeless 2  PHQ - 2 Score 2  Altered sleeping 0  Tired, decreased energy 1  Change in appetite 0  Feeling bad or failure about yourself  0  Trouble concentrating 0  Moving slowly or fidgety/restless 0  Suicidal thoughts 0  PHQ-9 Score 3  Difficult doing work/chores Not difficult at all

## 2018-10-16 ENCOUNTER — Ambulatory Visit: Payer: BLUE CROSS/BLUE SHIELD | Admitting: Family Medicine

## 2018-10-16 ENCOUNTER — Telehealth: Payer: Self-pay | Admitting: Family Medicine

## 2018-10-16 NOTE — Telephone Encounter (Signed)
Sure

## 2018-10-16 NOTE — Telephone Encounter (Signed)
Please advise if pt can take same day for Tuesday   Copied from CRM [id number]. Topic: Appointment Scheduling - Scheduling Inquiry for Clinic >> Oct 16, 2018 11:31 AM Wyonia Hough E wrote: Reason for CRM: Pt cancelled appt today due to wanting to see Dr. Earlene Plater family advised her to se her PCP about the issue. Pt would like to be worked into the same day appt slot that is on Tuesday Feb 4th at 4pm / please advise

## 2018-10-19 NOTE — Telephone Encounter (Signed)
Called pt and left vm to schedule for tomorrow 10/19/2018

## 2018-10-20 ENCOUNTER — Ambulatory Visit: Payer: BLUE CROSS/BLUE SHIELD | Admitting: Family Medicine

## 2018-10-20 ENCOUNTER — Encounter: Payer: Self-pay | Admitting: Family Medicine

## 2018-10-20 VITALS — BP 98/66 | HR 73 | Temp 97.3°F | Ht 61.0 in | Wt 147.6 lb

## 2018-10-20 DIAGNOSIS — R591 Generalized enlarged lymph nodes: Secondary | ICD-10-CM | POA: Diagnosis not present

## 2018-10-20 DIAGNOSIS — Z965 Presence of tooth-root and mandibular implants: Secondary | ICD-10-CM

## 2018-10-20 MED ORDER — FLUCONAZOLE 150 MG PO TABS: 150.0000 mg | ORAL_TABLET | Freq: Once | ORAL | 0 refills | Status: AC

## 2018-10-20 MED ORDER — AMOXICILLIN 875 MG PO TABS: 875.0000 mg | ORAL_TABLET | Freq: Two times a day (BID) | ORAL | 0 refills | Status: DC

## 2018-10-23 ENCOUNTER — Encounter: Payer: Self-pay | Admitting: Family Medicine

## 2018-10-23 NOTE — Progress Notes (Signed)
Krista Peterson is a 43 y.o. female is here for follow up.  History of Present Illness:   HPI: Right otalgia, with lump behind right ear. Recent right molar implant. Feels that she has had many complications since the implant. Wants it out eventually. Stressed about it. Costly. Worried about the possibility of metal intolerance.    Health Maintenance Due  Topic Date Due  . HIV Screening  04/07/1991  . TETANUS/TDAP  04/07/1995   Depression screen PHQ 2/9 09/07/2018  Decreased Interest 0  Down, Depressed, Hopeless 2  PHQ - 2 Score 2  Altered sleeping 0  Tired, decreased energy 1  Change in appetite 0  Feeling bad or failure about yourself  0  Trouble concentrating 0  Moving slowly or fidgety/restless 0  Suicidal thoughts 0  PHQ-9 Score 3  Difficult doing work/chores Not difficult at all   PMHx, SurgHx, SocialHx, FamHx, Medications, and Allergies were reviewed in the Visit Navigator and updated as appropriate.  There are no active problems to display for this patient.  Social History   Tobacco Use  . Smoking status: Never Smoker  . Smokeless tobacco: Never Used  Substance Use Topics  . Alcohol use: Yes    Comment: rarely  . Drug use: No   Current Medications and Allergies   .  Azelastine-Fluticasone 137-50 MCG/ACT SUSP, Place 1 spray into the nose daily., Disp: , Rfl:  .  esomeprazole (NEXIUM) 40 MG capsule, , Disp: , Rfl:   No Active Allergies   Review of Systems   Pertinent items are noted in the HPI. Otherwise, a complete ROS is negative.  Vitals   Vitals:   10/20/18 1609  BP: 98/66  Pulse: 73  Temp: (!) 97.3 F (36.3 C)  TempSrc: Oral  SpO2: 98%  Weight: 147 lb 9.6 oz (67 kg)  Height: 5\' 1"  (1.549 m)     Body mass index is 27.89 kg/m.  Physical Exam   Physical Exam Vitals signs and nursing note reviewed.  HENT:     Head: Normocephalic and atraumatic.     Jaw: No trismus.     Salivary Glands: Right salivary gland is not diffusely enlarged or  tender.     Right Ear: Tympanic membrane normal.     Left Ear: Tympanic membrane normal.     Nose: No mucosal edema or rhinorrhea.     Mouth/Throat:     Mouth: Mucous membranes are moist. No oral lesions.     Dentition: No dental abscesses or gum lesions.     Pharynx: Oropharynx is clear.     Tonsils: No tonsillar exudate or tonsillar abscesses.  Eyes:     Pupils: Pupils are equal, round, and reactive to light.  Neck:     Musculoskeletal: Normal range of motion and neck supple.  Cardiovascular:     Rate and Rhythm: Normal rate and regular rhythm.     Heart sounds: Normal heart sounds.  Pulmonary:     Effort: Pulmonary effort is normal.  Abdominal:     Palpations: Abdomen is soft.  Lymphadenopathy:     Head:     Right side of head: Posterior auricular adenopathy present.  Skin:    General: Skin is warm.  Psychiatric:        Behavior: Behavior normal.    Assessment and Plan   Krista Peterson was seen today for right sided neck/head pain and pain in b/l thighs.  Diagnoses and all orders for this visit:  Lymphadenopathy of head and neck  Comments: New. Antibiotic. Recheck if not improved in 2 weeks.   Presence of tooth-root and mandibular implants  Other orders -     amoxicillin (AMOXIL) 875 MG tablet; Take 1 tablet (875 mg total) by mouth 2 (two) times daily. -     fluconazole (DIFLUCAN) 150 MG tablet; Take 1 tablet (150 mg total) by mouth once for 1 dose. One po on day one, then again 3 days later.   . Orders and follow up as documented in EpicCare, reviewed diet, exercise and weight control, cardiovascular risk and specific lipid/LDL goals reviewed, reviewed medications and side effects in detail.  . Reviewed expectations re: course of current medical issues. . Outlined signs and symptoms indicating need for more acute intervention. . Patient verbalized understanding and all questions were answered. . Patient received an After Visit Summary.  Helane Rima, DO [place], [facility] 10/23/2018

## 2018-11-16 ENCOUNTER — Ambulatory Visit (INDEPENDENT_AMBULATORY_CARE_PROVIDER_SITE_OTHER): Payer: BLUE CROSS/BLUE SHIELD

## 2018-11-16 DIAGNOSIS — E538 Deficiency of other specified B group vitamins: Secondary | ICD-10-CM | POA: Diagnosis not present

## 2018-11-16 MED ORDER — CYANOCOBALAMIN 1000 MCG/ML IJ SOLN
1000.0000 ug | Freq: Once | INTRAMUSCULAR | Status: AC
  Administered 2018-11-16: 1000 ug via INTRAMUSCULAR

## 2018-11-16 NOTE — Progress Notes (Signed)
Per orders of Dr. Earlene Plater, injection of Vitamin b12 1000 mcg  given left deltoid IM by Olevia Bowens, CMA Patient tolerated injection well and she will schedule her next injection for 1 month from now.

## 2018-12-09 ENCOUNTER — Telehealth: Payer: Self-pay | Admitting: *Deleted

## 2018-12-09 NOTE — Telephone Encounter (Signed)
Spoke to pt told her need to cancel Nurse visit for 4/2 we are pusihng nurse visits out one month due virus and not having pt's come to office. Pt verbalized understanding. Told pt Dr. Earlene Plater would like you to take Vit B12 1000 mcg OTC supplement till next injection. Also please call back in one month to schedule just in case we a pushing out further. Pt verbalized understanding.

## 2018-12-17 ENCOUNTER — Ambulatory Visit: Payer: BLUE CROSS/BLUE SHIELD

## 2019-03-01 ENCOUNTER — Other Ambulatory Visit: Payer: Self-pay

## 2019-03-01 ENCOUNTER — Ambulatory Visit (INDEPENDENT_AMBULATORY_CARE_PROVIDER_SITE_OTHER): Payer: BC Managed Care – PPO

## 2019-03-01 ENCOUNTER — Ambulatory Visit: Payer: BLUE CROSS/BLUE SHIELD | Admitting: Family Medicine

## 2019-03-01 DIAGNOSIS — E538 Deficiency of other specified B group vitamins: Secondary | ICD-10-CM | POA: Diagnosis not present

## 2019-03-01 MED ORDER — CYANOCOBALAMIN 1000 MCG/ML IJ SOLN
1000.0000 ug | Freq: Once | INTRAMUSCULAR | Status: AC
  Administered 2019-03-01: 1000 ug via INTRAMUSCULAR

## 2019-03-01 NOTE — Progress Notes (Signed)
Per orders of Dr. Juleen China, injection of Vitamin b12 1000 mcg given left deltoid IM by Kevan Ny, CMA  Pt states that she has not had an injection since beginning of March.  States that "her uncle gave her an injection of b12 1000 MCG last week since she was having numbness in her arm.  Per Dr. Juleen China, okay to give injection today.  Patient also stated that she had been taking an OTC supplement of 1000 mcg qd.  Advised her to d/c this immediately since resuming injections. She verbalize understanding. Patient tolerated injection well.  Pt instructed to return in 1 month for next injection

## 2019-03-25 ENCOUNTER — Ambulatory Visit: Payer: Self-pay

## 2019-03-25 NOTE — Telephone Encounter (Signed)
Patient called and says she's been having abdominal pain above her belly button now for about 8-9 months, constant pain at at 4-5. She says she's seen GI for the pain and nothing they do is relieving the pain. She says she wants to have a lower endoscopy done to see if there is anything going on, such as an ulcer. She denies any other symptoms. She says the pain gets better after she has a bowel movement and it's worse with stress and food. I called the office and spoke to [place], [facility] who asks to speak to the patient, the call was connected successfully.  Answer Assessment - Initial Assessment Questions 1. LOCATION: "Where does it hurt?"      Above belly button 2. RADIATION: "Does the pain shoot anywhere else?" (e.g., chest, back)     No 3. ONSET: "When did the pain begin?" (e.g., minutes, hours or days ago)      Off and on since last September or October 4. SUDDEN: "Gradual or sudden onset?"     Gradual 5. PATTERN "Does the pain come and go, or is it constant?"    - If constant: "Is it getting better, staying the same, or worsening?"      (Note: Constant means the pain never goes away completely; most serious pain is constant and it progresses)     - If intermittent: "How long does it last?" "Do you have pain now?"     (Note: Intermittent means the pain goes away completely between bouts)     Always there 6. SEVERITY: "How bad is the pain?"  (e.g., Scale 1-10; mild, moderate, or severe)    - MILD (1-3): doesn't interfere with normal activities, abdomen soft and not tender to touch     - MODERATE (4-7): interferes with normal activities or awakens from sleep, tender to touch     - SEVERE (8-10): excruciating pain, doubled over, unable to do any normal activities       4-5 7. RECURRENT SYMPTOM: "Have you ever had this type of abdominal pain before?" If so, ask: "When was the last time?" and "What happened that time?"      Ongoing for the past 8 months 8. AGGRAVATING FACTORS: "Does anything seem  to cause this pain?" (e.g., foods, stress, alcohol)     Stress and foods make it worse 9. CARDIAC SYMPTOMS: "Do you have any of the following symptoms: chest pain, difficulty breathing, sweating, nausea?"     No 10. OTHER SYMPTOMS: "Do you have any other symptoms?" (e.g., fever, vomiting, diarrhea)      No 11. PREGNANCY: "Is there any chance you are pregnant?" "When was your last menstrual period?"      No  Protocols used: ABDOMINAL PAIN - UPPER-A-AH

## 2019-03-25 NOTE — Telephone Encounter (Signed)
Krista Peterson called back let her know we are not doing work in she can make app with another provider or go to walk in /ED

## 2019-03-30 ENCOUNTER — Other Ambulatory Visit: Payer: Self-pay

## 2019-03-30 ENCOUNTER — Ambulatory Visit (INDEPENDENT_AMBULATORY_CARE_PROVIDER_SITE_OTHER): Payer: BC Managed Care – PPO

## 2019-03-30 DIAGNOSIS — E538 Deficiency of other specified B group vitamins: Secondary | ICD-10-CM

## 2019-03-30 MED ORDER — CYANOCOBALAMIN 1000 MCG/ML IJ SOLN
1000.0000 ug | Freq: Once | INTRAMUSCULAR | Status: AC
  Administered 2019-03-30: 1000 ug via INTRAMUSCULAR

## 2019-03-30 NOTE — Progress Notes (Addendum)
Per orders of Dr.Wallace, injection of B12 given in right deltoid by Odyn Turko V Sakiyah Shur, RN  Patient tolerated injection well.  

## 2019-03-31 DIAGNOSIS — R1013 Epigastric pain: Secondary | ICD-10-CM | POA: Diagnosis not present

## 2019-04-14 NOTE — Progress Notes (Signed)
Noted and agree. Dmetrius Ambs, DO 

## 2019-04-22 ENCOUNTER — Ambulatory Visit (INDEPENDENT_AMBULATORY_CARE_PROVIDER_SITE_OTHER): Payer: BC Managed Care – PPO

## 2019-04-22 DIAGNOSIS — E538 Deficiency of other specified B group vitamins: Secondary | ICD-10-CM | POA: Diagnosis not present

## 2019-04-22 MED ORDER — CYANOCOBALAMIN 1000 MCG/ML IJ SOLN
1000.0000 ug | Freq: Once | INTRAMUSCULAR | Status: AC
  Administered 2019-04-22: 1000 ug via INTRAMUSCULAR

## 2019-04-22 NOTE — Progress Notes (Signed)
Per orders of Dr. Andy, injection of Vitamin b12 1000 mcg given left deltoid IM by Darion Milewski M Mukund Weinreb, CMA Patient tolerated injection well.  She will return in 1 month for her next injection.  

## 2019-04-27 ENCOUNTER — Ambulatory Visit: Payer: BC Managed Care – PPO

## 2019-04-30 ENCOUNTER — Telehealth: Payer: Self-pay | Admitting: *Deleted

## 2019-04-30 NOTE — Telephone Encounter (Signed)
Pt called c/o abdominal pain and having her second cycle within 1 month. 1st cycle started 7/26 - 29th. 2nd cycle started 8/12 and still on. She reports that her pain is constant, located lower abdomen and above belly button. Pain is worse when sitting and c/o of stomach tightness and constipation. She denies nausea/vomiting.  Spoke with Dr. Juleen China, she is in agreeance that pt needs further evaluation at ER or UC. Pt reports that she would rather go to UC, she was given information for [facility] in [facility].

## 2019-05-02 ENCOUNTER — Encounter (HOSPITAL_BASED_OUTPATIENT_CLINIC_OR_DEPARTMENT_OTHER): Payer: Self-pay | Admitting: Emergency Medicine

## 2019-05-02 ENCOUNTER — Emergency Department (HOSPITAL_BASED_OUTPATIENT_CLINIC_OR_DEPARTMENT_OTHER)
Admission: EM | Admit: 2019-05-02 | Discharge: 2019-05-02 | Disposition: A | Discharge status: Home / Self Care | Payer: BC Managed Care – PPO | Attending: Emergency Medicine | Admitting: Emergency Medicine

## 2019-05-02 ENCOUNTER — Other Ambulatory Visit: Payer: Self-pay

## 2019-05-02 DIAGNOSIS — N760 Acute vaginitis: Secondary | ICD-10-CM | POA: Diagnosis not present

## 2019-05-02 DIAGNOSIS — R1084 Generalized abdominal pain: Secondary | ICD-10-CM | POA: Diagnosis not present

## 2019-05-02 DIAGNOSIS — B9689 Other specified bacterial agents as the cause of diseases classified elsewhere: Secondary | ICD-10-CM

## 2019-05-02 LAB — URINALYSIS, MICROSCOPIC (REFLEX)

## 2019-05-02 LAB — WET PREP, GENITAL
Sperm: NONE SEEN
Trich, Wet Prep: NONE SEEN
Yeast Wet Prep HPF POC: NONE SEEN

## 2019-05-02 LAB — URINALYSIS, ROUTINE W REFLEX MICROSCOPIC
Bilirubin Urine: NEGATIVE
Glucose, UA: NEGATIVE mg/dL
Ketones, ur: NEGATIVE mg/dL
Leukocytes,Ua: NEGATIVE
Nitrite: NEGATIVE
Protein, ur: NEGATIVE mg/dL
Specific Gravity, Urine: 1.01 (ref 1.005–1.030)
pH: 6 (ref 5.0–8.0)

## 2019-05-02 LAB — PREGNANCY, URINE: Preg Test, Ur: NEGATIVE

## 2019-05-02 MED ORDER — METRONIDAZOLE 500 MG PO TABS: 500.0000 mg | ORAL_TABLET | Freq: Two times a day (BID) | ORAL | 0 refills | Status: AC

## 2019-05-02 NOTE — ED Triage Notes (Signed)
Lower abd cramping x 4 days. She had some vaginal bleeding but that has subsided.

## 2019-05-02 NOTE — ED Provider Notes (Signed)
[facility] EMERGENCY DEPARTMENT Provider Note   CSN: [id number] Arrival date & time: 05/02/19  1210    History   Chief Complaint Chief Complaint  Patient presents with  . Abdominal Pain    HPI Krista Peterson is a 43 y.o. female.     The history is provided by the patient.  Vaginal Discharge Quality:  Thin Severity:  Mild Onset quality:  Gradual Progression:  Improving Chronicity:  New Context: spontaneously   Relieved by:  Nothing Worsened by:  Nothing Associated symptoms: abdominal pain (lower cramping over the last several days, resolved)   Associated symptoms: no dysuria, no fever, no nausea, no urinary frequency, no urinary hesitancy, no urinary incontinence, no vaginal itching and no vomiting   Risk factors: new sexual partner   Risk factors: no STI exposure     Past Medical History:  Diagnosis Date  . Kidney stones     There are no active problems to display for this patient.   Past Surgical History:  Procedure Laterality Date  . LITHOTRIPSY       OB History   No obstetric history on file.      Home Medications    Prior to Admission medications   Medication Sig Start Date End Date Taking? Authorizing Provider  Azelastine-Fluticasone 137-50 MCG/ACT SUSP Place 1 spray into the nose daily.    [provider]    Family History Family History  Problem Relation Age of Onset  . Hypertension Father     Social History Social History   Tobacco Use  . Smoking status: Never Smoker  . Smokeless tobacco: Never Used  Substance Use Topics  . Alcohol use: Yes    Comment: rarely  . Drug use: No     Allergies   Patient has no known allergies.   Review of Systems Review of Systems  Constitutional: Negative for chills and fever.  HENT: Negative for ear pain and sore throat.   Eyes: Negative for pain and visual disturbance.  Respiratory: Negative for cough and shortness of breath.   Cardiovascular: Negative for chest pain  and palpitations.  Gastrointestinal: Positive for abdominal pain (lower cramping over the last several days, resolved). Negative for nausea and vomiting.  Genitourinary: Positive for vaginal bleeding (scant) and vaginal discharge. Negative for bladder incontinence, decreased urine volume, difficulty urinating, dysuria, enuresis, flank pain, frequency, genital sores, hematuria, hesitancy, pelvic pain and urgency.  Musculoskeletal: Negative for arthralgias and back pain.  Skin: Negative for color change and rash.  Neurological: Negative for seizures and syncope.  All other systems reviewed and are negative.    Physical Exam Updated Vital Signs  ED Triage Vitals  Enc Vitals Group     BP 05/02/19 1217 (!) 131/92     Pulse Rate 05/02/19 1217 80     Resp 05/02/19 1217 16     Temp 05/02/19 1217 99.3 F (37.4 C)     Temp Source 05/02/19 1217 Oral     SpO2 05/02/19 1217 98 %     Weight 05/02/19 1216 151 lb (68.5 kg)     Height 05/02/19 1216 5\' 2"  (1.575 m)     Head Circumference --      Peak Flow --      Pain Score 05/02/19 1217 5     Pain Loc --      Pain Edu? --      Excl. in GC? --     Physical Exam Vitals signs and nursing note reviewed.  Constitutional:  General: She is not in acute distress.    Appearance: She is well-developed.  HENT:     Head: Normocephalic and atraumatic.     Mouth/Throat:     Mouth: Mucous membranes are moist.  Eyes:     Extraocular Movements: Extraocular movements intact.     Conjunctiva/sclera: Conjunctivae normal.     Pupils: Pupils are equal, round, and reactive to light.  Neck:     Musculoskeletal: Neck supple.  Cardiovascular:     Rate and Rhythm: Normal rate and regular rhythm.     Heart sounds: No murmur.  Pulmonary:     Effort: Pulmonary effort is normal. No respiratory distress.     Breath sounds: Normal breath sounds.  Abdominal:     General: Abdomen is flat. Bowel sounds are normal.     Palpations: Abdomen is soft.      Tenderness: There is no abdominal tenderness.  Genitourinary:    Vagina: Normal. No erythema or bleeding.     Cervix: Discharge (scant, clear, thin) present. No cervical motion tenderness.  Skin:    General: Skin is warm and dry.     Capillary Refill: Capillary refill takes less than 2 seconds.  Neurological:     General: No focal deficit present.     Mental Status: She is alert.  Psychiatric:        Mood and Affect: Mood normal.      ED Treatments / Results  Labs (all labs ordered are listed, but only abnormal results are displayed) Labs Reviewed  WET PREP, GENITAL - Abnormal; Notable for the following components:      Result Value   Clue Cells Wet Prep HPF POC PRESENT (*)    WBC, Wet Prep HPF POC MANY (*)    All other components within normal limits  URINALYSIS, ROUTINE W REFLEX MICROSCOPIC - Abnormal; Notable for the following components:   Hgb urine dipstick SMALL (*)    All other components within normal limits  URINALYSIS, MICROSCOPIC (REFLEX) - Abnormal; Notable for the following components:   Bacteria, UA FEW (*)    All other components within normal limits  PREGNANCY, URINE  GC/CHLAMYDIA PROBE AMP ([place]) NOT AT [facility]    EKG None  Radiology No results found.  Procedures Procedures (including critical care time)  Medications Ordered in ED Medications - No data to display   Initial Impression / Assessment and Plan / ED Course  I have reviewed the triage vital signs and the nursing notes.  Pertinent labs & imaging results that were available during my care of the patient were reviewed by me and considered in my medical decision making (see chart for details).     Krista Peterson is a 43 year old female with no significant medical history who presents to the ED with lower abdominal cramping, vaginal discharge.  Patient with normal vitals.  No fever.  Denies any concern for STDs.  Denies any specific UTI symptoms.  Had some scant vaginal discharge on  exam.  Was positive for bacterial vaginosis.  Did not want to be empirically treated for STDs at this time.  Urinalysis was overall unremarkable.  No concern for PID.  No concern for appendicitis.  Will treat with Flagyl.  Given return precautions and discharged from ED in good condition.  This chart was dictated using voice recognition software.  Despite best efforts to proofread,  errors can occur which can change the documentation meaning.    Final Clinical Impressions(s) / ED Diagnoses   Final  diagnoses:  BV (bacterial vaginosis)    ED Discharge Orders    None       Lennice Sites, DO 05/02/19 1339

## 2019-05-03 ENCOUNTER — Telehealth: Payer: Self-pay

## 2019-05-03 NOTE — Telephone Encounter (Signed)
Copied from [place] [id number]. Topic: Complaint - Staff >> May 03, 2019  4:28 PM Jeri Cos wrote: Date of Incident: 04/30/2019 Details of complaint: Pt stated that she was advised by a member of the office staff that there was a Urgent Care located at [facility] After being given this information pt was seen in the ED and now has a bill for $600 that she does not want to pay. Pt stated that she only went to [facility] because the person she talked to told her it was an UC and not and ED. PT stated that she asked continually was there an UC located there because her insurance would not cover and ED visit and was repeatedly told "yes". How would the patient like to see it resolved? Pt would like to be called back  Route to Engineer, building services.

## 2019-05-04 ENCOUNTER — Telehealth: Payer: Self-pay

## 2019-05-04 ENCOUNTER — Telehealth: Payer: Self-pay | Admitting: Family Medicine

## 2019-05-04 LAB — GC/CHLAMYDIA PROBE AMP (~~LOC~~) NOT AT ARMC
Chlamydia: NEGATIVE
Neisseria Gonorrhea: NEGATIVE

## 2019-05-04 NOTE — Telephone Encounter (Signed)
Patient states metroNIDAZOLE (FLAGYL) 500 MG tablet caused a yeast infection , patient requesting 2 dose diflucan, please advise   [id number] [address], [place] [phone] (Phone) [phone] (Fax)

## 2019-05-04 NOTE — Telephone Encounter (Signed)
See below

## 2019-05-04 NOTE — Telephone Encounter (Signed)
Message sent in a TE.

## 2019-05-04 NOTE — Telephone Encounter (Signed)
Patient called stating that she has a yeast infection due to last medication, Flagyl.  Patient is requesting to send in Diflucan (fluconazole) to help with yeast infection.  Please advise.

## 2019-05-04 NOTE — Telephone Encounter (Signed)
Please advise, patient only wanted to speak with you.

## 2019-05-04 NOTE — Telephone Encounter (Signed)
Patient checking on the status of message below, advised please allow the 2 to 3 turn around time, please advise

## 2019-05-06 ENCOUNTER — Other Ambulatory Visit: Payer: Self-pay

## 2019-05-06 DIAGNOSIS — B373 Candidiasis of vulva and vagina: Secondary | ICD-10-CM

## 2019-05-06 DIAGNOSIS — B3731 Acute candidiasis of vulva and vagina: Secondary | ICD-10-CM

## 2019-05-06 MED ORDER — FLUCONAZOLE 150 MG PO TABS: ORAL_TABLET | ORAL | 0 refills | Status: DC

## 2019-05-06 NOTE — Telephone Encounter (Signed)
That is fine. Diflucan 150 mg po x 1, again 3 days later. #2.

## 2019-05-06 NOTE — Telephone Encounter (Signed)
Medication sent in today.  Lt message for pt to call office.

## 2019-05-10 NOTE — Telephone Encounter (Signed)
I called and spoke to the patient. I apologized for her being giving misinformation and would discuss that with staff. I explained to her that it is patient responsibility to verify if a is a site that  is covered on her insurance. She disagreed and stated that she was going to file a complaint because she felt that she did not need to pay.

## 2019-05-19 ENCOUNTER — Other Ambulatory Visit (HOSPITAL_COMMUNITY)
Admission: RE | Admit: 2019-05-19 | Discharge: 2019-05-19 | Disposition: A | Discharge status: Home / Self Care | Payer: BC Managed Care – PPO | Source: Ambulatory Visit | Attending: Family Medicine | Admitting: Family Medicine

## 2019-05-19 ENCOUNTER — Other Ambulatory Visit: Payer: Self-pay | Admitting: Family Medicine

## 2019-05-19 DIAGNOSIS — F411 Generalized anxiety disorder: Secondary | ICD-10-CM | POA: Diagnosis not present

## 2019-05-19 DIAGNOSIS — Z23 Encounter for immunization: Secondary | ICD-10-CM | POA: Diagnosis not present

## 2019-05-19 DIAGNOSIS — M7989 Other specified soft tissue disorders: Secondary | ICD-10-CM | POA: Diagnosis not present

## 2019-05-19 DIAGNOSIS — Z124 Encounter for screening for malignant neoplasm of cervix: Secondary | ICD-10-CM | POA: Insufficient documentation

## 2019-05-19 DIAGNOSIS — E559 Vitamin D deficiency, unspecified: Secondary | ICD-10-CM | POA: Diagnosis not present

## 2019-05-19 DIAGNOSIS — K219 Gastro-esophageal reflux disease without esophagitis: Secondary | ICD-10-CM | POA: Diagnosis not present

## 2019-05-19 DIAGNOSIS — Z0000 Encounter for general adult medical examination without abnormal findings: Secondary | ICD-10-CM | POA: Diagnosis not present

## 2019-05-20 ENCOUNTER — Other Ambulatory Visit: Payer: Self-pay | Admitting: Family Medicine

## 2019-05-20 DIAGNOSIS — M7989 Other specified soft tissue disorders: Secondary | ICD-10-CM

## 2019-05-21 ENCOUNTER — Ambulatory Visit
Admission: RE | Admit: 2019-05-21 | Discharge: 2019-05-21 | Disposition: A | Discharge status: Home / Self Care | Payer: BC Managed Care – PPO | Source: Ambulatory Visit | Attending: Family Medicine | Admitting: Family Medicine

## 2019-05-21 DIAGNOSIS — M7989 Other specified soft tissue disorders: Secondary | ICD-10-CM

## 2019-05-21 DIAGNOSIS — R600 Localized edema: Secondary | ICD-10-CM | POA: Diagnosis not present

## 2019-05-25 ENCOUNTER — Ambulatory Visit: Payer: BC Managed Care – PPO

## 2019-05-26 LAB — CYTOLOGY - PAP
Diagnosis: UNDETERMINED — AB
HPV: NOT DETECTED

## 2019-06-21 DIAGNOSIS — N898 Other specified noninflammatory disorders of vagina: Secondary | ICD-10-CM | POA: Diagnosis not present

## 2019-06-22 ENCOUNTER — Other Ambulatory Visit: Payer: Self-pay | Admitting: Family Medicine

## 2019-07-11 DIAGNOSIS — N926 Irregular menstruation, unspecified: Secondary | ICD-10-CM | POA: Diagnosis not present

## 2019-07-11 DIAGNOSIS — N390 Urinary tract infection, site not specified: Secondary | ICD-10-CM | POA: Diagnosis not present

## 2019-07-11 DIAGNOSIS — N771 Vaginitis, vulvitis and vulvovaginitis in diseases classified elsewhere: Secondary | ICD-10-CM | POA: Diagnosis not present

## 2019-08-25 DIAGNOSIS — M7712 Lateral epicondylitis, left elbow: Secondary | ICD-10-CM | POA: Diagnosis not present

## 2019-08-25 DIAGNOSIS — S46312A Strain of muscle, fascia and tendon of triceps, left arm, initial encounter: Secondary | ICD-10-CM | POA: Diagnosis not present

## 2019-09-01 DIAGNOSIS — Z1159 Encounter for screening for other viral diseases: Secondary | ICD-10-CM | POA: Diagnosis not present

## 2019-09-06 DIAGNOSIS — R1013 Epigastric pain: Secondary | ICD-10-CM | POA: Diagnosis not present

## 2019-09-06 DIAGNOSIS — K2940 Chronic atrophic gastritis without bleeding: Secondary | ICD-10-CM | POA: Diagnosis not present

## 2019-09-06 DIAGNOSIS — K3189 Other diseases of stomach and duodenum: Secondary | ICD-10-CM | POA: Diagnosis not present

## 2019-11-22 DIAGNOSIS — M791 Myalgia, unspecified site: Secondary | ICD-10-CM | POA: Diagnosis not present

## 2019-12-15 ENCOUNTER — Other Ambulatory Visit: Payer: Self-pay | Admitting: *Deleted

## 2019-12-15 DIAGNOSIS — I83893 Varicose veins of bilateral lower extremities with other complications: Secondary | ICD-10-CM

## 2019-12-16 ENCOUNTER — Ambulatory Visit (HOSPITAL_COMMUNITY)
Admission: RE | Admit: 2019-12-16 | Discharge: 2019-12-16 | Disposition: A | Discharge status: Home / Self Care | Payer: BC Managed Care – PPO | Source: Ambulatory Visit | Attending: Vascular Surgery | Admitting: Vascular Surgery

## 2019-12-16 ENCOUNTER — Encounter: Payer: Self-pay | Admitting: Vascular Surgery

## 2019-12-16 ENCOUNTER — Other Ambulatory Visit: Payer: Self-pay

## 2019-12-16 ENCOUNTER — Ambulatory Visit (INDEPENDENT_AMBULATORY_CARE_PROVIDER_SITE_OTHER): Payer: BC Managed Care – PPO | Admitting: Physician Assistant

## 2019-12-16 VITALS — BP 99/70 | HR 67 | Temp 97.8°F | Ht 62.0 in | Wt 160.7 lb

## 2019-12-16 DIAGNOSIS — I872 Venous insufficiency (chronic) (peripheral): Secondary | ICD-10-CM | POA: Diagnosis not present

## 2019-12-16 DIAGNOSIS — I83893 Varicose veins of bilateral lower extremities with other complications: Secondary | ICD-10-CM

## 2019-12-16 NOTE — Progress Notes (Signed)
Requested by:  Jarrett Soho, PA-C [address] [place],  [place] [address]  Reason for consultation: leg pain and swelling   History of Present Illness   Krista Peterson is a 44 y.o. (1976/02/03) female who presents for evaluation of lower extremity swelling and pain. 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 She does add that she has gained a lot of weight over the past year and currently weighs the most that she has in her life. She describes her leg pain as pressure in her thighs and behind her knees. Aching and soreness in her thighs, knee joints, and calves. She explains that she can "feel the blood moving" in her legs. This is usually worsened with prolonged ambulation. Of note she does say that her forearms and elbows feel similarly. She explains that some days her muscles " feel dead". She has noticed some improvement with elevating her legs in regard to the pain but she notices no change in the swelling. She has never wore compression stockings before, however she does state that she had some tight clothing on her thighs one day and she was unable to tolerate it. For this reason she is not sure she will tolerate stockings. She will occasionally take Ibuprofen for the pain if it is really bothersome. She has also tried [place] with minimal relief  Venous symptoms include: aching, heaviness, swelling, pressure Occupation: Engineer, mining Aggravating factors: prolonged sitting or standing Alleviating factors: elevation Pain medications:  Occasionally Tylenol or Ibuprofen Previous vein procedures:  None Family history of venous disease: Mother possibly with venous insufficiency History of DVT:  No  Past Medical History:  Diagnosis Date  . Kidney stones     Past Surgical History:  Procedure Laterality Date    . LITHOTRIPSY      Social History   Socioeconomic History  . Marital status: Married    Spouse name: Not on file  . Number of children: 1  . Years of education: Not on file  . Highest education level: Not on file  Occupational History    Employer: [employer]  Tobacco Use  . Smoking status: Never Smoker  . Smokeless tobacco: Never Used  Substance and Sexual Activity  . Alcohol use: Yes    Comment: rarely  . Drug use: No  . Sexual activity: Not Currently    Partners: Male  Other Topics Concern  . Not on file  Social History Narrative   Divorced. From [place]. Moved here since her son's father lives here, along with other family. Son is at [employer]. Only child. Patient works at [employer] in [place]. Not currently dating. Co-parenting well with ex-husband. 09/08/2018    Social Determinants of Health   Financial Resource Strain:   . Difficulty of Paying Living Expenses:   Food Insecurity:   . Worried About Programme researcher, broadcasting/film/video in the Last Year:   . Barista in the Last Year:   Transportation Needs:   . Freight forwarder (Medical):   Marland Kitchen Lack of Transportation (Non-Medical):   Physical Activity:   . Days of Exercise per Week:   . Minutes of Exercise per Session:   Stress:   . Feeling of Stress :   Social Connections:   . Frequency of Communication with  Friends and Family:   . Frequency of Social Gatherings with Friends and Family:   . Attends Religious Services:   . Active Member of Clubs or Organizations:   . Attends Banker Meetings:   Marland Kitchen Marital Status:   Intimate Partner Violence:   . Fear of Current or Ex-Partner:   . Emotionally Abused:   Marland Kitchen Physically Abused:   . Sexually Abused:     Family History  Problem Relation Age of Onset  . Hypertension Father     Current Outpatient Medications  Medication Sig Dispense Refill  . Azelastine-Fluticasone 137-50 MCG/ACT SUSP Place 1 spray into the nose daily.     No  current facility-administered medications for this visit.    Allergies  Allergen Reactions  . Augmentin [Amoxicillin-Pot Clavulanate]     Pt prefers not to take due to causes "very bad yeast infections"    REVIEW OF SYSTEMS:  Review of Systems  Constitutional: Negative for chills, fever and malaise/fatigue.  Eyes: Negative for blurred vision.  Respiratory: Negative for cough and shortness of breath.   Cardiovascular: Negative for chest pain, palpitations and claudication.  Gastrointestinal: Negative for abdominal pain, constipation, diarrhea, nausea and vomiting.  Genitourinary: Negative for dysuria.  Musculoskeletal: Positive for joint pain.  Skin: Negative for rash.  Neurological: Positive for focal weakness. Negative for dizziness and headaches.  Endo/Heme/Allergies: Bruises/bleeds easily.    Physical Examination     Vitals:   12/16/19 1505  BP: 99/70  Pulse: 67  Temp: 97.8 F (36.6 C)  SpO2: 100%  Weight: 160 lb 11.2 oz (72.9 kg)  Height: 5\' 2"  (1.575 m)   Body mass index is 29.39 kg/m.  General:  WDWN in NAD; vital signs documented above Gait: Not observed HENT: WNL, normocephalic Pulmonary: normal non-labored breathing , without Rales, rhonchi,  wheezing Cardiac: regular HR, without  Murmurs without carotid bruit Abdomen: soft, NT, no masses Skin: without rashes Vascular Exam/Pulses:2+ femoral pulses bilaterally, 2 + popliteal pulses, 2+ Dorsalis pedis and posterior tibial pulses bilaterally. Bilateral lower extremities well perfused and warm. She does have obese lower extremities so somewhat difficult to appreciate if swelling is present Extremities: without varicose veins, she does have some reticular veins right lateral proximal leg, right popliteal fossa, left medial proximal leg, left popliteal fossa, without edema, without stasis pigmentation, without lipodermatosclerosis, without ulcers Musculoskeletal: no muscle wasting or atrophy  Neurologic: A&O X 3;   No focal weakness or paresthesias are detected Psychiatric:  The pt has Normal affect.  Non-invasive Vascular Imaging   BLE Venous Insufficiency Duplex (12/16/19):   RLE:   -No DVT , no SVT  - No deep reflux  - No SFJ reflux, Superficial reflux in the mid right thigh GSV  - No evidence of superficial reflux in the short saphenous vein  - veins in the RLE  < 3.6 mm   LLE:  - No DVT, no SVT  - Deep reflux in the common femoral vein  - No SFJ reflux, superficial reflux in the mid left thigh GSV  - No evidence of superficial reflux in the short saphenous vein  - Veins in the LLE are < 3 mm throughout  Medical Decision Making   Countess Biebel is a 44 y.o. female who presents with: Bilateral chronic venous insufficiency, and varicose veins with lower extremity swelling and discomfort. Based on her duplex findings I expressed to her that a lot of her symptoms are likely not related to her venous disease and are more musculoskeletal  in origin. I recommend that she follow up with her PCP to further evaluate her for the underlying causes. She may have component of arthritis vs myalgias as she describes both upper extremity elbow pain and forearm aching as well as in her thighs and knees.  Based on the patient's history and examination, I recommend compression stockings and elevation of bilateral lower extremities  She is not a candidate for any ablation procedure due to the minimal reflux and small caliber of her veins. At this time her symptoms do not warrant an other more invasive venous procedures  I discussed with the patient the use of her 20-30 mm thigh high compression stockings and need for 3 month trial of such.  I have encouraged her to elevate her legs as well as continue to exercise  The patient will follow up in 3 months with the [facility] clinic   Karoline Caldwell, PA-C [facility] Office: [phone]  12/16/2019, 3:42 PM  Clinic MD: Dr. Oneida Alar

## 2020-03-23 ENCOUNTER — Ambulatory Visit: Payer: BC Managed Care – PPO | Admitting: Vascular Surgery

## 2020-03-28 ENCOUNTER — Encounter: Payer: Self-pay | Admitting: Surgery

## 2020-05-19 DIAGNOSIS — Z0000 Encounter for general adult medical examination without abnormal findings: Secondary | ICD-10-CM | POA: Diagnosis not present

## 2020-05-30 DIAGNOSIS — Z131 Encounter for screening for diabetes mellitus: Secondary | ICD-10-CM | POA: Diagnosis not present

## 2020-05-30 DIAGNOSIS — E538 Deficiency of other specified B group vitamins: Secondary | ICD-10-CM | POA: Diagnosis not present

## 2020-05-30 DIAGNOSIS — Z13220 Encounter for screening for lipoid disorders: Secondary | ICD-10-CM | POA: Diagnosis not present

## 2020-05-30 DIAGNOSIS — E559 Vitamin D deficiency, unspecified: Secondary | ICD-10-CM | POA: Diagnosis not present

## 2020-07-28 DIAGNOSIS — M778 Other enthesopathies, not elsewhere classified: Secondary | ICD-10-CM | POA: Diagnosis not present

## 2020-07-28 DIAGNOSIS — M79645 Pain in left finger(s): Secondary | ICD-10-CM | POA: Diagnosis not present

## 2020-08-24 DIAGNOSIS — M79641 Pain in right hand: Secondary | ICD-10-CM | POA: Diagnosis not present

## 2020-09-29 ENCOUNTER — Other Ambulatory Visit: Payer: Self-pay

## 2020-09-29 ENCOUNTER — Encounter (HOSPITAL_COMMUNITY): Payer: Self-pay | Admitting: Emergency Medicine

## 2020-09-29 ENCOUNTER — Ambulatory Visit (HOSPITAL_COMMUNITY)
Admission: EM | Admit: 2020-09-29 | Discharge: 2020-09-29 | Disposition: A | Discharge status: Home / Self Care | Payer: BC Managed Care – PPO | Attending: Emergency Medicine | Admitting: Emergency Medicine

## 2020-09-29 DIAGNOSIS — B349 Viral infection, unspecified: Secondary | ICD-10-CM | POA: Diagnosis not present

## 2020-09-29 DIAGNOSIS — U071 COVID-19: Secondary | ICD-10-CM | POA: Diagnosis not present

## 2020-09-29 DIAGNOSIS — R059 Cough, unspecified: Secondary | ICD-10-CM | POA: Diagnosis not present

## 2020-09-29 DIAGNOSIS — R519 Headache, unspecified: Secondary | ICD-10-CM | POA: Diagnosis not present

## 2020-09-29 DIAGNOSIS — M542 Cervicalgia: Secondary | ICD-10-CM | POA: Diagnosis not present

## 2020-09-29 LAB — SARS CORONAVIRUS 2 (TAT 6-24 HRS): SARS Coronavirus 2: POSITIVE — AB

## 2020-09-29 MED ORDER — ACETAMINOPHEN 325 MG PO TABS
650.0000 mg | ORAL_TABLET | Freq: Once | ORAL | Status: AC
  Administered 2020-09-29: 650 mg via ORAL

## 2020-09-29 MED ORDER — ACETAMINOPHEN 325 MG PO TABS
ORAL_TABLET | ORAL | Status: AC
  Filled 2020-09-29: qty 2

## 2020-09-29 NOTE — Discharge Instructions (Signed)
Your COVID test is pending.  You should self quarantine until the test result is back.    Take Tylenol or ibuprofen as needed for fever or discomfort.  Rest and keep yourself hydrated.    Follow-up with your primary care provider if your symptoms are not improving.     

## 2020-09-29 NOTE — ED Triage Notes (Signed)
Patient c/o headache, nonproductive cough, and fever x 3 days.   Patient endorses fever of 102 F at home.   Patient endorses neck pain x 2 weeks.   Patient has taken Tylenol at home w/ no relief of symptoms.

## 2020-09-29 NOTE — ED Provider Notes (Signed)
[facility]    CSN: [id number] Arrival date & time: 09/29/20  1152      History   Chief Complaint Chief Complaint  Patient presents with  . Headache  . Cough  . Fever    HPI Krista Peterson is a 45 y.o. female.   Patient presents with 3-day history of fever, headache, mild nonproductive cough.  T-max 102.  She also reports left side neck pain x2 weeks.  Treatment attempted at home with Tylenol; last dose taken 12 hours ago.  She denies rash, shortness of breath, vomiting, diarrhea, or other symptoms.  Her medical history includes kidney stones.  The history is provided by the patient and medical records.    Past Medical History:  Diagnosis Date  . Kidney stones     There are no problems to display for this patient.   Past Surgical History:  Procedure Laterality Date  . LITHOTRIPSY      OB History   No obstetric history on file.      Home Medications    Prior to Admission medications   Medication Sig Start Date End Date Taking? Authorizing Provider  Azelastine-Fluticasone 137-50 MCG/ACT SUSP Place 1 spray into the nose daily.    [provider]    Family History Family History  Problem Relation Age of Onset  . Hypertension Father     Social History Social History   Tobacco Use  . Smoking status: Never Smoker  . Smokeless tobacco: Never Used  Substance Use Topics  . Alcohol use: Yes    Comment: rarely  . Drug use: No     Allergies   Augmentin [amoxicillin-pot clavulanate]   Review of Systems Review of Systems  Constitutional: Positive for fever. Negative for chills.  HENT: Negative for ear pain and sore throat.   Eyes: Negative for pain and visual disturbance.  Respiratory: Positive for cough. Negative for shortness of breath.   Cardiovascular: Negative for chest pain and palpitations.  Gastrointestinal: Negative for abdominal pain, diarrhea and vomiting.  Genitourinary: Negative for dysuria and hematuria.   Musculoskeletal: Positive for neck pain. Negative for arthralgias and back pain.  Skin: Negative for color change and rash.  Neurological: Positive for headaches. Negative for syncope, weakness and numbness.  All other systems reviewed and are negative.    Physical Exam Triage Vital Signs ED Triage Vitals  Enc Vitals Group     BP      Pulse      Resp      Temp      Temp src      SpO2      Weight      Height      Head Circumference      Peak Flow      Pain Score      Pain Loc      Pain Edu?      Excl. in GC?    No data found.  Updated Vital Signs BP (!) 104/57 (BP Location: Left Arm)   Pulse (!) 110   Temp (!) 101.7 F (38.7 C) (Oral)   Resp 18   Ht 5\' 2"  (1.575 m)   Wt 160 lb (72.6 kg)   LMP 09/28/2020   SpO2 96%   BMI 29.26 kg/m   Visual Acuity Right Eye Distance:   Left Eye Distance:   Bilateral Distance:    Right Eye Near:   Left Eye Near:    Bilateral Near:     Physical Exam  Vitals and nursing note reviewed.  Constitutional:      General: She is not in acute distress.    Appearance: She is well-developed and well-nourished.  HENT:     Head: Normocephalic and atraumatic.     Mouth/Throat:     Mouth: Mucous membranes are moist.  Eyes:     Conjunctiva/sclera: Conjunctivae normal.  Cardiovascular:     Rate and Rhythm: Normal rate and regular rhythm.     Heart sounds: Normal heart sounds.  Pulmonary:     Effort: Pulmonary effort is normal. No respiratory distress.     Breath sounds: Normal breath sounds.  Abdominal:     Palpations: Abdomen is soft.     Tenderness: There is no abdominal tenderness.  Musculoskeletal:        General: No edema. Normal range of motion.     Cervical back: Normal range of motion and neck supple. No rigidity.  Skin:    General: Skin is warm and dry.     Findings: No rash.  Neurological:     General: No focal deficit present.     Mental Status: She is alert and oriented to person, place, and time.     Gait: Gait  normal.  Psychiatric:        Mood and Affect: Mood and affect and mood normal.        Behavior: Behavior normal.      UC Treatments / Results  Labs (all labs ordered are listed, but only abnormal results are displayed) Labs Reviewed  SARS CORONAVIRUS 2 (TAT 6-24 HRS)    EKG   Radiology No results found.  Procedures Procedures (including critical care time)  Medications Ordered in UC Medications  acetaminophen (TYLENOL) tablet 650 mg (has no administration in time range)    Initial Impression / Assessment and Plan / UC Course  I have reviewed the triage vital signs and the nursing notes.  Pertinent labs & imaging results that were available during my care of the patient were reviewed by me and considered in my medical decision making (see chart for details).   Viral illness.  COVID pending.  Instructed patient to self quarantine until the test results are back.  Discussed symptomatic treatment including Tylenol, rest, hydration.  Instructed patient to follow up with PCP if her symptoms are not improving.  Patient agrees to plan of care.    Final Clinical Impressions(s) / UC Diagnoses   Final diagnoses:  Viral illness     Discharge Instructions     Your COVID test is pending.  You should self quarantine until the test result is back.    Take Tylenol or ibuprofen as needed for fever or discomfort.  Rest and keep yourself hydrated.    Follow-up with your primary care provider if your symptoms are not improving.        ED Prescriptions    None     PDMP not reviewed this encounter.   Mickie Bail, NP 09/29/20 1358

## 2020-11-06 DIAGNOSIS — J209 Acute bronchitis, unspecified: Secondary | ICD-10-CM | POA: Diagnosis not present

## 2020-11-06 DIAGNOSIS — J0100 Acute maxillary sinusitis, unspecified: Secondary | ICD-10-CM | POA: Diagnosis not present

## 2020-11-06 DIAGNOSIS — R059 Cough, unspecified: Secondary | ICD-10-CM | POA: Diagnosis not present

## 2021-06-05 DIAGNOSIS — E559 Vitamin D deficiency, unspecified: Secondary | ICD-10-CM | POA: Diagnosis not present

## 2021-06-05 DIAGNOSIS — Z0000 Encounter for general adult medical examination without abnormal findings: Secondary | ICD-10-CM | POA: Diagnosis not present

## 2021-06-05 DIAGNOSIS — M79672 Pain in left foot: Secondary | ICD-10-CM | POA: Diagnosis not present

## 2021-06-05 DIAGNOSIS — M79671 Pain in right foot: Secondary | ICD-10-CM | POA: Diagnosis not present

## 2021-06-05 DIAGNOSIS — J309 Allergic rhinitis, unspecified: Secondary | ICD-10-CM | POA: Diagnosis not present

## 2021-06-05 DIAGNOSIS — Z13220 Encounter for screening for lipoid disorders: Secondary | ICD-10-CM | POA: Diagnosis not present

## 2021-06-05 DIAGNOSIS — R600 Localized edema: Secondary | ICD-10-CM | POA: Diagnosis not present

## 2021-06-19 DIAGNOSIS — M25562 Pain in left knee: Secondary | ICD-10-CM | POA: Diagnosis not present

## 2021-06-19 DIAGNOSIS — M722 Plantar fascial fibromatosis: Secondary | ICD-10-CM | POA: Diagnosis not present

## 2021-06-19 DIAGNOSIS — M25561 Pain in right knee: Secondary | ICD-10-CM | POA: Diagnosis not present

## 2021-06-21 ENCOUNTER — Other Ambulatory Visit: Payer: Self-pay | Admitting: Sports Medicine

## 2021-06-21 ENCOUNTER — Ambulatory Visit
Admission: RE | Admit: 2021-06-21 | Discharge: 2021-06-21 | Disposition: A | Discharge status: Home / Self Care | Payer: BC Managed Care – PPO | Source: Ambulatory Visit | Attending: Sports Medicine | Admitting: Sports Medicine

## 2021-06-21 DIAGNOSIS — M25562 Pain in left knee: Secondary | ICD-10-CM

## 2021-06-21 DIAGNOSIS — M25561 Pain in right knee: Secondary | ICD-10-CM

## 2021-06-21 DIAGNOSIS — M1712 Unilateral primary osteoarthritis, left knee: Secondary | ICD-10-CM | POA: Diagnosis not present

## 2021-06-21 DIAGNOSIS — M1711 Unilateral primary osteoarthritis, right knee: Secondary | ICD-10-CM | POA: Diagnosis not present

## 2021-07-25 DIAGNOSIS — R509 Fever, unspecified: Secondary | ICD-10-CM | POA: Diagnosis not present

## 2021-07-25 DIAGNOSIS — R0981 Nasal congestion: Secondary | ICD-10-CM | POA: Diagnosis not present

## 2021-07-25 DIAGNOSIS — R059 Cough, unspecified: Secondary | ICD-10-CM | POA: Diagnosis not present

## 2021-07-25 DIAGNOSIS — Z03818 Encounter for observation for suspected exposure to other biological agents ruled out: Secondary | ICD-10-CM | POA: Diagnosis not present

## 2021-07-25 DIAGNOSIS — R52 Pain, unspecified: Secondary | ICD-10-CM | POA: Diagnosis not present

## 2021-08-29 DIAGNOSIS — H6690 Otitis media, unspecified, unspecified ear: Secondary | ICD-10-CM | POA: Diagnosis not present

## 2021-08-29 DIAGNOSIS — R059 Cough, unspecified: Secondary | ICD-10-CM | POA: Diagnosis not present

## 2021-08-29 DIAGNOSIS — J0110 Acute frontal sinusitis, unspecified: Secondary | ICD-10-CM | POA: Diagnosis not present

## 2021-09-22 ENCOUNTER — Encounter (HOSPITAL_COMMUNITY): Payer: Self-pay | Admitting: Emergency Medicine

## 2021-09-22 ENCOUNTER — Ambulatory Visit (HOSPITAL_COMMUNITY)
Admission: EM | Admit: 2021-09-22 | Discharge: 2021-09-22 | Disposition: A | Discharge status: Home / Self Care | Payer: Managed Care, Other (non HMO) | Attending: Physician Assistant | Admitting: Physician Assistant

## 2021-09-22 DIAGNOSIS — J101 Influenza due to other identified influenza virus with other respiratory manifestations: Secondary | ICD-10-CM | POA: Diagnosis not present

## 2021-09-22 LAB — POC INFLUENZA A AND B ANTIGEN (URGENT CARE ONLY)
INFLUENZA A ANTIGEN, POC: POSITIVE — AB
INFLUENZA B ANTIGEN, POC: NEGATIVE

## 2021-09-22 MED ORDER — OSELTAMIVIR PHOSPHATE 75 MG PO CAPS: 75.0000 mg | ORAL_CAPSULE | Freq: Two times a day (BID) | ORAL | 0 refills | Status: DC

## 2021-09-22 NOTE — ED Triage Notes (Signed)
Since Thursday had fevers, chills, body aches, cough, congestion headaches and bad leg pains.

## 2021-09-22 NOTE — ED Provider Notes (Signed)
[facility]    CSN: [id number] Arrival date & time: 09/22/21  1153      History   Chief Complaint Chief Complaint  Patient presents with   Fever   Cough   Generalized Body Aches    HPI Krista Peterson is a 46 y.o. female.   Patient here today for evaluation of fever, body aches, congestion, cough and nausea that started about 2 days ago. She has not had any vomiting or diarrhea. She has had some abdominal pain. She has tried tylenol without significant relief.  The history is provided by the patient.  Fever Associated symptoms: congestion, cough, myalgias, nausea and sore throat   Associated symptoms: no diarrhea, no ear pain and no vomiting   Cough Associated symptoms: fever, myalgias and sore throat   Associated symptoms: no ear pain, no eye discharge, no shortness of breath and no wheezing    Past Medical History:  Diagnosis Date   Kidney stones     There are no problems to display for this patient.   Past Surgical History:  Procedure Laterality Date   LITHOTRIPSY      OB History   No obstetric history on file.      Home Medications    Prior to Admission medications   Medication Sig Start Date End Date Taking? Authorizing Provider  oseltamivir (TAMIFLU) 75 MG capsule Take 1 capsule (75 mg total) by mouth every 12 (twelve) hours. 09/22/21  Yes Tomi Bamberger, PA-C  Azelastine-Fluticasone 137-50 MCG/ACT SUSP Place 1 spray into the nose daily.    [provider]    Family History Family History  Problem Relation Age of Onset   Hypertension Father     Social History Social History   Tobacco Use   Smoking status: Never   Smokeless tobacco: Never  Substance Use Topics   Alcohol use: Yes    Comment: rarely   Drug use: No     Allergies   Augmentin [amoxicillin-pot clavulanate]   Review of Systems Review of Systems  Constitutional:  Positive for fever.  HENT:  Positive for congestion and sore throat. Negative for ear pain.    Eyes:  Negative for discharge and redness.  Respiratory:  Positive for cough. Negative for shortness of breath and wheezing.   Gastrointestinal:  Positive for nausea. Negative for abdominal pain, diarrhea and vomiting.  Musculoskeletal:  Positive for myalgias.    Physical Exam Triage Vital Signs ED Triage Vitals  Enc Vitals Group     BP 09/22/21 1401 102/71     Pulse Rate 09/22/21 1401 98     Resp 09/22/21 1401 18     Temp 09/22/21 1401 98.3 F (36.8 C)     Temp Source 09/22/21 1401 Oral     SpO2 09/22/21 1401 98 %     Weight --      Height --      Head Circumference --      Peak Flow --      Pain Score 09/22/21 1400 10     Pain Loc --      Pain Edu? --      Excl. in GC? --    No data found.  Updated Vital Signs BP 102/71 (BP Location: Right Arm)    Pulse 98    Temp 98.3 F (36.8 C) (Oral)    Resp 18    SpO2 98%      Physical Exam Vitals and nursing note reviewed.  Constitutional:  General: She is not in acute distress.    Appearance: Normal appearance. She is not ill-appearing.  HENT:     Head: Normocephalic and atraumatic.     Nose: Congestion present.     Mouth/Throat:     Mouth: Mucous membranes are moist.     Pharynx: No oropharyngeal exudate or posterior oropharyngeal erythema.  Eyes:     Conjunctiva/sclera: Conjunctivae normal.  Cardiovascular:     Rate and Rhythm: Normal rate and regular rhythm.     Heart sounds: Normal heart sounds. No murmur heard. Pulmonary:     Effort: Pulmonary effort is normal. No respiratory distress.     Breath sounds: Normal breath sounds. No wheezing, rhonchi or rales.  Skin:    General: Skin is warm and dry.  Neurological:     Mental Status: She is alert.  Psychiatric:        Mood and Affect: Mood normal.        Thought Content: Thought content normal.     UC Treatments / Results  Labs (all labs ordered are listed, but only abnormal results are displayed) Labs Reviewed  POC INFLUENZA A AND B ANTIGEN (URGENT  CARE ONLY) - Abnormal; Notable for the following components:      Result Value   INFLUENZA A ANTIGEN, POC POSITIVE (*)    All other components within normal limits    EKG   Radiology No results found.  Procedures Procedures (including critical care time)  Medications Ordered in UC Medications - No data to display  Initial Impression / Assessment and Plan / UC Course  I have reviewed the triage vital signs and the nursing notes.  Pertinent labs & imaging results that were available during my care of the patient were reviewed by me and considered in my medical decision making (see chart for details).   Flu screening positive in office. Tamiflu prescribed. Recommended symptomatic treatment, increased fluids and rest otherwise. Encouraged follow up with any further concerns.  Final Clinical Impressions(s) / UC Diagnoses   Final diagnoses:  Influenza A   Discharge Instructions   None    ED Prescriptions     Medication Sig Dispense Auth. Provider   oseltamivir (TAMIFLU) 75 MG capsule Take 1 capsule (75 mg total) by mouth every 12 (twelve) hours. 10 capsule Tomi Bamberger, PA-C      PDMP not reviewed this encounter.   Tomi Bamberger, PA-C 09/22/21 1454

## 2021-09-22 NOTE — ED Triage Notes (Signed)
Pt reports couple days ago having fevers, cough, congestion, abd pains, nausea.

## 2022-09-19 DIAGNOSIS — R509 Fever, unspecified: Secondary | ICD-10-CM | POA: Diagnosis not present

## 2022-09-19 DIAGNOSIS — Z03818 Encounter for observation for suspected exposure to other biological agents ruled out: Secondary | ICD-10-CM | POA: Diagnosis not present

## 2022-09-19 DIAGNOSIS — U071 COVID-19: Secondary | ICD-10-CM | POA: Diagnosis not present

## 2022-09-19 DIAGNOSIS — R6889 Other general symptoms and signs: Secondary | ICD-10-CM | POA: Diagnosis not present

## 2023-05-23 DIAGNOSIS — J019 Acute sinusitis, unspecified: Secondary | ICD-10-CM | POA: Diagnosis not present

## 2023-05-25 DIAGNOSIS — J0100 Acute maxillary sinusitis, unspecified: Secondary | ICD-10-CM | POA: Diagnosis not present

## 2023-07-07 DIAGNOSIS — E559 Vitamin D deficiency, unspecified: Secondary | ICD-10-CM | POA: Diagnosis not present

## 2023-07-07 DIAGNOSIS — Z0000 Encounter for general adult medical examination without abnormal findings: Secondary | ICD-10-CM | POA: Diagnosis not present

## 2023-07-07 DIAGNOSIS — Z13220 Encounter for screening for lipoid disorders: Secondary | ICD-10-CM | POA: Diagnosis not present

## 2023-07-07 DIAGNOSIS — E538 Deficiency of other specified B group vitamins: Secondary | ICD-10-CM | POA: Diagnosis not present

## 2023-07-17 DIAGNOSIS — R300 Dysuria: Secondary | ICD-10-CM | POA: Diagnosis not present

## 2023-07-17 DIAGNOSIS — R1030 Lower abdominal pain, unspecified: Secondary | ICD-10-CM | POA: Diagnosis not present

## 2023-08-17 ENCOUNTER — Emergency Department (HOSPITAL_BASED_OUTPATIENT_CLINIC_OR_DEPARTMENT_OTHER)
Admission: EM | Admit: 2023-08-17 | Discharge: 2023-08-17 | Disposition: A | Discharge status: Home / Self Care | Payer: 59 | Attending: Emergency Medicine | Admitting: Emergency Medicine

## 2023-08-17 ENCOUNTER — Encounter (HOSPITAL_BASED_OUTPATIENT_CLINIC_OR_DEPARTMENT_OTHER): Payer: Self-pay | Admitting: *Deleted

## 2023-08-17 ENCOUNTER — Other Ambulatory Visit: Payer: Self-pay

## 2023-08-17 ENCOUNTER — Emergency Department (HOSPITAL_BASED_OUTPATIENT_CLINIC_OR_DEPARTMENT_OTHER): Payer: 59

## 2023-08-17 DIAGNOSIS — R1030 Lower abdominal pain, unspecified: Secondary | ICD-10-CM | POA: Insufficient documentation

## 2023-08-17 DIAGNOSIS — N132 Hydronephrosis with renal and ureteral calculous obstruction: Secondary | ICD-10-CM | POA: Diagnosis not present

## 2023-08-17 DIAGNOSIS — R110 Nausea: Secondary | ICD-10-CM | POA: Diagnosis not present

## 2023-08-17 DIAGNOSIS — R109 Unspecified abdominal pain: Secondary | ICD-10-CM | POA: Diagnosis not present

## 2023-08-17 DIAGNOSIS — N200 Calculus of kidney: Secondary | ICD-10-CM | POA: Diagnosis not present

## 2023-08-17 DIAGNOSIS — R799 Abnormal finding of blood chemistry, unspecified: Secondary | ICD-10-CM | POA: Diagnosis not present

## 2023-08-17 LAB — CBC WITH DIFFERENTIAL/PLATELET
Abs Immature Granulocytes: 0.01 10*3/uL (ref 0.00–0.07)
Basophils Absolute: 0 10*3/uL (ref 0.0–0.1)
Basophils Relative: 0 %
Eosinophils Absolute: 0.2 10*3/uL (ref 0.0–0.5)
Eosinophils Relative: 3 %
HCT: 36.3 % (ref 36.0–46.0)
Hemoglobin: 11.8 g/dL — ABNORMAL LOW (ref 12.0–15.0)
Immature Granulocytes: 0 %
Lymphocytes Relative: 48 %
Lymphs Abs: 2.8 10*3/uL (ref 0.7–4.0)
MCH: 28.9 pg (ref 26.0–34.0)
MCHC: 32.5 g/dL (ref 30.0–36.0)
MCV: 89 fL (ref 80.0–100.0)
Monocytes Absolute: 0.3 10*3/uL (ref 0.1–1.0)
Monocytes Relative: 5 %
Neutro Abs: 2.6 10*3/uL (ref 1.7–7.7)
Neutrophils Relative %: 44 %
Platelets: 280 10*3/uL (ref 150–400)
RBC: 4.08 MIL/uL (ref 3.87–5.11)
RDW: 13.1 % (ref 11.5–15.5)
WBC: 5.9 10*3/uL (ref 4.0–10.5)
nRBC: 0 % (ref 0.0–0.2)

## 2023-08-17 LAB — COMPREHENSIVE METABOLIC PANEL
ALT: 10 U/L (ref 0–44)
AST: 8 U/L — ABNORMAL LOW (ref 15–41)
Albumin: 3.7 g/dL (ref 3.5–5.0)
Alkaline Phosphatase: 58 U/L (ref 38–126)
Anion gap: 6 (ref 5–15)
BUN: 13 mg/dL (ref 6–20)
CO2: 27 mmol/L (ref 22–32)
Calcium: 8.6 mg/dL — ABNORMAL LOW (ref 8.9–10.3)
Chloride: 109 mmol/L (ref 98–111)
Creatinine, Ser: 0.64 mg/dL (ref 0.44–1.00)
GFR, Estimated: >60 mL/min (ref >60)
Glucose, Bld: 121 mg/dL — ABNORMAL HIGH (ref 70–99)
Potassium: 3.6 mmol/L (ref 3.5–5.1)
Sodium: 142 mmol/L (ref 135–145)
Total Bilirubin: 0.4 mg/dL (ref <1.2)
Total Protein: 6.5 g/dL (ref 6.5–8.1)

## 2023-08-17 LAB — URINALYSIS, ROUTINE W REFLEX MICROSCOPIC
Bacteria, UA: NONE SEEN
Bilirubin Urine: NEGATIVE
Glucose, UA: NEGATIVE mg/dL
Ketones, ur: NEGATIVE mg/dL
Nitrite: NEGATIVE
RBC / HPF: >50 RBC/hpf (ref 0–5)
Specific Gravity, Urine: 1.027 (ref 1.005–1.030)
pH: 5.5 (ref 5.0–8.0)

## 2023-08-17 LAB — PREGNANCY, URINE: Preg Test, Ur: NEGATIVE

## 2023-08-17 LAB — CBG MONITORING, ED: Glucose-Capillary: 103 mg/dL — ABNORMAL HIGH (ref 70–99)

## 2023-08-17 LAB — LIPASE, BLOOD: Lipase: <10 U/L — ABNORMAL LOW (ref 11–51)

## 2023-08-17 MED ORDER — HYDROMORPHONE HCL 1 MG/ML IJ SOLN
1.0000 mg | Freq: Once | INTRAMUSCULAR | Status: AC
  Administered 2023-08-17: 1 mg via INTRAVENOUS
  Filled 2023-08-17: qty 1

## 2023-08-17 MED ORDER — LACTATED RINGERS IV BOLUS
1000.0000 mL | Freq: Once | INTRAVENOUS | Status: AC
  Administered 2023-08-17: 1000 mL via INTRAVENOUS

## 2023-08-17 MED ORDER — METOCLOPRAMIDE HCL 5 MG/ML IJ SOLN
10.0000 mg | Freq: Once | INTRAMUSCULAR | Status: AC
  Administered 2023-08-17: 10 mg via INTRAVENOUS
  Filled 2023-08-17: qty 2

## 2023-08-17 MED ORDER — KETOROLAC TROMETHAMINE 15 MG/ML IJ SOLN
15.0000 mg | Freq: Once | INTRAMUSCULAR | Status: AC
Start: 2023-08-17 — End: 2023-08-17
  Administered 2023-08-17: 15 mg via INTRAVENOUS
  Filled 2023-08-17: qty 1

## 2023-08-17 MED ORDER — TAMSULOSIN HCL 0.4 MG PO CAPS
0.4000 mg | ORAL_CAPSULE | Freq: Once | ORAL | Status: AC
  Administered 2023-08-17: 0.4 mg via ORAL
  Filled 2023-08-17: qty 1

## 2023-08-17 MED ORDER — IOHEXOL 300 MG/ML  SOLN
80.0000 mL | Freq: Once | INTRAMUSCULAR | Status: AC | PRN
  Administered 2023-08-17: 80 mL via INTRAVENOUS

## 2023-08-17 MED ORDER — OXYCODONE-ACETAMINOPHEN 5-325 MG PO TABS: 1.0000 | ORAL_TABLET | Freq: Four times a day (QID) | ORAL | 0 refills | Status: AC | PRN

## 2023-08-17 MED ORDER — ONDANSETRON HCL 4 MG/2ML IJ SOLN
4.0000 mg | Freq: Once | INTRAMUSCULAR | Status: AC
  Administered 2023-08-17: 4 mg via INTRAVENOUS
  Filled 2023-08-17: qty 2

## 2023-08-17 MED ORDER — ONDANSETRON 4 MG PO TBDP: 4.0000 mg | ORAL_TABLET | Freq: Three times a day (TID) | ORAL | 0 refills | Status: AC | PRN

## 2023-08-17 NOTE — ED Provider Notes (Signed)
I have independently visualized and interpreted pt's images today.  Renal stone study today with evidence of obstructing stone on the right.  Radiology reports 5 mm obstructing calculus in the distal third of the right ureter with moderate proximal right hydronephrosis.  She also has nonobstructing stones measuring up to 7 mm in the left kidney as well.  Labs are reassuring.  UA appears to be contaminated but low suspicion for UTI.  On repeat evaluation patient still having significant pain with 2 mg of Dilaudid and Flomax.  She was given a dose of Toradol.  Will attempt to pain control.  9:00 AM On repeat evaluation patient's pain has improved.  She did have 1 further episode of emesis but is trying to drink some juice.    9:35 AM Pt still having emesis but pain improved.  Will try further antiemetics.   1:25 PM Patient is now tolerating p.o.'s and feels much better.  A little bit dizzy when she gets up to walk.  Patient's blood pressure has been soft here but suspect that is related to the Flomax she had earlier.  Will not give any further Flomax.  Patient's pain has completely resolved and hopeful that the stone has passed.  Given she has further kidney stones will give a referral to [facility].  Discussed with the patient and her husband.  At this time patient appears stable for discharge.   Gwyneth Sprout, MD 08/17/23 1326

## 2023-08-17 NOTE — ED Notes (Signed)
Transport to ct

## 2023-08-17 NOTE — ED Notes (Signed)
Pt returned from CT scan. CT tech informed RN pt vomited in CT.

## 2023-08-17 NOTE — ED Provider Notes (Signed)
[place] EMERGENCY DEPARTMENT AT [facility] Provider Note   CSN: [id number] Arrival date & time: 08/17/23  0542     History {Add pertinent medical, surgical, social history, OB history to HPI:1} Chief Complaint  Patient presents with   Flank Pain    Krista Peterson is a 47 y.o. female.  47 year old female with a history of kidney stones that presents the ER today secondary to symptoms of the same.  Patient states that she feels like she started having some lower abdominal pain that has since radiated into the right side of her back.  She states this feels like kidney stones she has had in the past and she has had multiple does not recently.  She states the only difference that she did not always have the abdominal pain with previous kidney stones.  She is nauseous but no vomiting.  No fevers.  No recent sick contacts.  No diarrhea or constipation.  No noted hematuria, dysuria or increased frequency recently.   Flank Pain       Home Medications Prior to Admission medications   Not on File      Allergies    Augmentin [amoxicillin-pot clavulanate]    Review of Systems   Review of Systems  Genitourinary:  Positive for flank pain.    Physical Exam Updated Vital Signs BP 112/79   Pulse (!) 57   Temp 98 F (36.7 C) (Temporal)   Resp 20   Ht 5\' 2"  (1.575 m)   Wt 68 kg   SpO2 100%   BMI 27.44 kg/m  Physical Exam Vitals and nursing note reviewed.  Constitutional:      Appearance: She is well-developed.  HENT:     Head: Normocephalic and atraumatic.  Eyes:     Pupils: Pupils are equal, round, and reactive to light.  Cardiovascular:     Rate and Rhythm: Normal rate and regular rhythm.  Pulmonary:     Effort: No respiratory distress.     Breath sounds: No stridor.  Abdominal:     General: There is no distension.     Tenderness: There is no abdominal tenderness.  Musculoskeletal:     Cervical back: Normal range of motion.     Comments: Right CVA   Skin:    General: Skin is warm and dry.  Neurological:     General: No focal deficit present.     Mental Status: She is alert.     ED Results / Procedures / Treatments   Labs (all labs ordered are listed, but only abnormal results are displayed) Labs Reviewed  PREGNANCY, URINE  URINALYSIS, ROUTINE W REFLEX MICROSCOPIC  CBC WITH DIFFERENTIAL/PLATELET  COMPREHENSIVE METABOLIC PANEL  LIPASE, BLOOD    EKG None  Radiology No results found.  Procedures Procedures  {Document cardiac monitor, telemetry assessment procedure when appropriate:1}  Medications Ordered in ED Medications  HYDROmorphone (DILAUDID) injection 1 mg (has no administration in time range)  lactated ringers bolus 1,000 mL (has no administration in time range)  ondansetron (ZOFRAN) injection 4 mg (has no administration in time range)  tamsulosin (FLOMAX) capsule 0.4 mg (has no administration in time range)    ED Course/ Medical Decision Making/ A&P   {   Click here for ABCD2, HEART and other calculatorsREFRESH Note before signing :1}                              Medical Decision Making Amount and/or Complexity  of Data Reviewed Labs: ordered. Radiology: ordered.  Risk Prescription drug management.   Likely kidney stone however with her atypical symptoms we will going get a scan with contrast to make sure she does not have appendicitis or some other etiology.  Check urine for infection.  Will treat symptomatically until diagnosis is made.  {Document critical care time when appropriate:1} {Document review of labs and clinical decision tools ie heart score, Chads2Vasc2 etc:1}  {Document your independent review of radiology images, and any outside records:1} {Document your discussion with family members, caretakers, and with consultants:1} {Document social determinants of health affecting pt's care:1} {Document your decision making why or why not admission, treatments were needed:1} Final Clinical  Impression(s) / ED Diagnoses Final diagnoses:  None    Rx / DC Orders ED Discharge Orders     None

## 2023-08-17 NOTE — ED Notes (Signed)
Spoke with lab to add on urine culture 

## 2023-08-17 NOTE — ED Triage Notes (Addendum)
Pt c/o of right flank pain that started at 4am. C/o nausea. Denies vomiting. Urinary frequency. Hx of kidney stone. Pt unable to sit still due to pain. Has not taken any pain meds pta.

## 2023-08-17 NOTE — ED Notes (Signed)
Pt able to tolerate 8oz water and cranberry juice 8 oz.states she feels better.

## 2023-08-17 NOTE — ED Notes (Signed)
Discharge instructions, follow up care with urology, and pain management reviewed and explained, pt verbalized understanding and had no further questions on d/c. Pt caox4 and NAD on d/c.

## 2023-08-17 NOTE — Discharge Instructions (Addendum)
A prescription for pain medication and nausea medicine was sent to your pharmacy that you can pick up if you need.  However if you are not having pain you do not need to take that medication.  If you start having high fevers, worsening pain, persistent vomiting return to the emergency room.  Your blood pressure has been a little low after taking the Flomax.  We are not going to prescribe that medication because you appear to be very sensitive to it.  Make sure over the next 6 to 8 hours that when you get up and walk you are somewhere you can steady yourself or someone to help you.  If you start feeling dizzy make sure you lay down

## 2023-08-17 NOTE — ED Notes (Signed)
Pt provided with cranberry juice at this time

## 2023-08-18 ENCOUNTER — Other Ambulatory Visit: Payer: Self-pay

## 2023-08-18 ENCOUNTER — Emergency Department (HOSPITAL_BASED_OUTPATIENT_CLINIC_OR_DEPARTMENT_OTHER)
Admission: EM | Admit: 2023-08-18 | Discharge: 2023-08-18 | Disposition: A | Discharge status: Home / Self Care | Payer: 59 | Attending: Emergency Medicine | Admitting: Emergency Medicine

## 2023-08-18 ENCOUNTER — Other Ambulatory Visit: Payer: Self-pay | Admitting: Urology

## 2023-08-18 ENCOUNTER — Encounter (HOSPITAL_BASED_OUTPATIENT_CLINIC_OR_DEPARTMENT_OTHER): Payer: Self-pay | Admitting: Emergency Medicine

## 2023-08-18 DIAGNOSIS — R109 Unspecified abdominal pain: Secondary | ICD-10-CM | POA: Diagnosis not present

## 2023-08-18 DIAGNOSIS — N200 Calculus of kidney: Secondary | ICD-10-CM | POA: Diagnosis not present

## 2023-08-18 DIAGNOSIS — N202 Calculus of kidney with calculus of ureter: Secondary | ICD-10-CM | POA: Diagnosis not present

## 2023-08-18 LAB — URINALYSIS, ROUTINE W REFLEX MICROSCOPIC
Bacteria, UA: NONE SEEN
Bilirubin Urine: NEGATIVE
Glucose, UA: NEGATIVE mg/dL
Ketones, ur: NEGATIVE mg/dL
Leukocytes,Ua: NEGATIVE
Nitrite: NEGATIVE
Specific Gravity, Urine: 1.017 (ref 1.005–1.030)
pH: 5.5 (ref 5.0–8.0)

## 2023-08-18 LAB — CBC WITH DIFFERENTIAL/PLATELET
Abs Immature Granulocytes: 0.04 10*3/uL (ref 0.00–0.07)
Basophils Absolute: 0 10*3/uL (ref 0.0–0.1)
Basophils Relative: 0 %
Eosinophils Absolute: 0 10*3/uL (ref 0.0–0.5)
Eosinophils Relative: 0 %
HCT: 35.2 % — ABNORMAL LOW (ref 36.0–46.0)
Hemoglobin: 11.4 g/dL — ABNORMAL LOW (ref 12.0–15.0)
Immature Granulocytes: 1 %
Lymphocytes Relative: 14 %
Lymphs Abs: 1.2 10*3/uL (ref 0.7–4.0)
MCH: 28.9 pg (ref 26.0–34.0)
MCHC: 32.4 g/dL (ref 30.0–36.0)
MCV: 89.3 fL (ref 80.0–100.0)
Monocytes Absolute: 0.3 10*3/uL (ref 0.1–1.0)
Monocytes Relative: 4 %
Neutro Abs: 7 10*3/uL (ref 1.7–7.7)
Neutrophils Relative %: 81 %
Platelets: 243 10*3/uL (ref 150–400)
RBC: 3.94 MIL/uL (ref 3.87–5.11)
RDW: 13.1 % (ref 11.5–15.5)
WBC: 8.6 10*3/uL (ref 4.0–10.5)
nRBC: 0 % (ref 0.0–0.2)

## 2023-08-18 LAB — URINE CULTURE

## 2023-08-18 LAB — COMPREHENSIVE METABOLIC PANEL
ALT: 13 U/L (ref 0–44)
AST: 10 U/L — ABNORMAL LOW (ref 15–41)
Albumin: 3.7 g/dL (ref 3.5–5.0)
Alkaline Phosphatase: 55 U/L (ref 38–126)
Anion gap: 7 (ref 5–15)
BUN: 11 mg/dL (ref 6–20)
CO2: 27 mmol/L (ref 22–32)
Calcium: 8.7 mg/dL — ABNORMAL LOW (ref 8.9–10.3)
Chloride: 104 mmol/L (ref 98–111)
Creatinine, Ser: 0.67 mg/dL (ref 0.44–1.00)
GFR, Estimated: >60 mL/min (ref >60)
Glucose, Bld: 107 mg/dL — ABNORMAL HIGH (ref 70–99)
Potassium: 3.6 mmol/L (ref 3.5–5.1)
Sodium: 138 mmol/L (ref 135–145)
Total Bilirubin: 0.5 mg/dL (ref <1.2)
Total Protein: 6.1 g/dL — ABNORMAL LOW (ref 6.5–8.1)

## 2023-08-18 MED ORDER — ONDANSETRON 4 MG PO TBDP: 4.0000 mg | ORAL_TABLET | Freq: Three times a day (TID) | ORAL | 0 refills | Status: AC | PRN

## 2023-08-18 MED ORDER — KETOROLAC TROMETHAMINE 30 MG/ML IJ SOLN
30.0000 mg | Freq: Once | INTRAMUSCULAR | Status: AC
Start: 2023-08-18 — End: 2023-08-18
  Administered 2023-08-18: 30 mg via INTRAVENOUS
  Filled 2023-08-18: qty 1

## 2023-08-18 MED ORDER — LACTATED RINGERS IV BOLUS
1000.0000 mL | Freq: Once | INTRAVENOUS | Status: AC
  Administered 2023-08-18: 1000 mL via INTRAVENOUS

## 2023-08-18 MED ORDER — HYDROMORPHONE HCL 1 MG/ML IJ SOLN
0.5000 mg | Freq: Once | INTRAMUSCULAR | Status: AC
  Administered 2023-08-18: 0.5 mg via INTRAVENOUS
  Filled 2023-08-18: qty 1

## 2023-08-18 MED ORDER — ONDANSETRON HCL 4 MG/2ML IJ SOLN
4.0000 mg | Freq: Once | INTRAMUSCULAR | Status: AC
Start: 2023-08-18 — End: 2023-08-18
  Administered 2023-08-18: 4 mg via INTRAVENOUS
  Filled 2023-08-18: qty 2

## 2023-08-18 MED ORDER — OXYCODONE HCL 5 MG PO TABS: 5.0000 mg | ORAL_TABLET | ORAL | 0 refills | Status: AC | PRN

## 2023-08-18 NOTE — ED Triage Notes (Signed)
C/o R sided flank pain. N/V. Seen here yesterday for same. Reports unable to keep pain medication down d/t vomiting.

## 2023-08-18 NOTE — ED Provider Notes (Signed)
[place] EMERGENCY DEPARTMENT AT [facility] Provider Note   CSN: [id number] Arrival date & time: 08/18/23  2951     History  Chief Complaint  Patient presents with   Flank Pain    Krista Peterson is a 47 y.o. female.  HPI     47 year old female with history of nephrolithiasis and diagnosis yesterday of 5 mm distal right ureteral stone presents with concern for right flank pain, nausea and vomiting.  Yesterday at 4 AM developed right flank pain with nausea, had CT that showed a 5 mm obstructive stone.  She was seen in the emergency department yesterday.  She did have some low blood pressures that were felt to be due to Flomax so this was not prescribed.  She improved prior to leaving the emergency department.  Reports she did not eat any medications overnight, when she woke up this morning and started getting ready for work the pain back.  The pain was unbearable, and associated with nausea and vomiting.  She tried to take her pain and nausea medication but threw it up.  The pain is located on the right side of her abdomen with radiation to her right flank.  It is severe, unbearable.  She is requesting lithotripsy.  She has had previous procedure done in [place].  No fever.  Bowel movement this morning but was unable to.  Did have a normal bowel movement yesterday.  No dysuria.    Past Medical History:  Diagnosis Date   Kidney stones      Home Medications Prior to Admission medications   Medication Sig Start Date End Date Taking? Authorizing Provider  ondansetron (ZOFRAN-ODT) 4 MG disintegrating tablet Take 1 tablet (4 mg total) by mouth every 8 (eight) hours as needed for nausea or vomiting. 08/18/23  Yes Alvira Monday, MD  oxyCODONE (ROXICODONE) 5 MG immediate release tablet Take 1 tablet (5 mg total) by mouth every 4 (four) hours as needed for severe pain (pain score 7-10). 08/18/23  Yes Alvira Monday, MD  ondansetron (ZOFRAN-ODT) 4 MG disintegrating tablet  Take 1 tablet (4 mg total) by mouth every 8 (eight) hours as needed for nausea or vomiting. 08/17/23   Gwyneth Sprout, MD  oxyCODONE-acetaminophen (PERCOCET/ROXICET) 5-325 MG tablet Take 1 tablet by mouth every 6 (six) hours as needed for severe pain (pain score 7-10). 08/17/23   Gwyneth Sprout, MD      Allergies    Augmentin [amoxicillin-pot clavulanate]    Review of Systems   Review of Systems  Physical Exam Updated Vital Signs BP 115/78 (BP Location: Right Arm)   Pulse 81   Temp 98.5 F (36.9 C) (Oral)   Resp 18   Ht 5\' 2"  (1.575 m)   Wt 68 kg   SpO2 100%   BMI 27.42 kg/m  Physical Exam Vitals and nursing note reviewed.  Constitutional:      General: She is in acute distress (pain).     Appearance: She is well-developed. She is not diaphoretic.  HENT:     Head: Normocephalic and atraumatic.  Eyes:     Conjunctiva/sclera: Conjunctivae normal.  Cardiovascular:     Rate and Rhythm: Normal rate and regular rhythm.  Pulmonary:     Effort: Pulmonary effort is normal. No respiratory distress.  Abdominal:     General: There is no distension.     Palpations: Abdomen is soft.     Tenderness: There is abdominal tenderness. There is right CVA tenderness. There is no guarding.  Musculoskeletal:  General: No tenderness.     Cervical back: Normal range of motion.  Skin:    General: Skin is warm and dry.     Findings: No erythema or rash.  Neurological:     Mental Status: She is alert and oriented to person, place, and time.     ED Results / Procedures / Treatments   Labs (all labs ordered are listed, but only abnormal results are displayed) Labs Reviewed  CBC WITH DIFFERENTIAL/PLATELET - Abnormal; Notable for the following components:      Result Value   Hemoglobin 11.4 (*)    HCT 35.2 (*)    All other components within normal limits  COMPREHENSIVE METABOLIC PANEL - Abnormal; Notable for the following components:   Glucose, Bld 107 (*)    Calcium 8.7 (*)     Total Protein 6.1 (*)    AST 10 (*)    All other components within normal limits  URINALYSIS, ROUTINE W REFLEX MICROSCOPIC - Abnormal; Notable for the following components:   Hgb urine dipstick SMALL (*)    Protein, ur TRACE (*)    All other components within normal limits    EKG None  Radiology CT ABDOMEN PELVIS W CONTRAST  Result Date: 08/17/2023 CLINICAL DATA:  47 year old female with history of acute onset of nonlocalized abdominal pain and flank pain. EXAM: CT ABDOMEN AND PELVIS WITH CONTRAST TECHNIQUE: Multidetector CT imaging of the abdomen and pelvis was performed using the standard protocol following bolus administration of intravenous contrast. RADIATION DOSE REDUCTION: This exam was performed according to the departmental dose-optimization program which includes automated exposure control, adjustment of the mA and/or kV according to patient size and/or use of iterative reconstruction technique. CONTRAST:  80mL OMNIPAQUE IOHEXOL 300 MG/ML  SOLN COMPARISON:  CT of the abdomen and pelvis 08/04/2015. FINDINGS: Lower chest: Unremarkable. Hepatobiliary: No suspicious cystic or solid hepatic lesions. No intra or extrahepatic biliary ductal dilatation. Gallbladder is unremarkable in appearance. Pancreas: No pancreatic mass. No pancreatic ductal dilatation. No pancreatic or peripancreatic fluid collections or inflammatory changes. Spleen: Unremarkable. Adrenals/Urinary Tract: Nonobstructive calculi are noted within the collecting systems of both kidneys measuring up to 7 mm in the lower pole collecting system of the left kidney. In addition, in the distal third of the right ureter (coronal image 38 of series 5) there is a 5 mm obstructive calculus. This is associated with moderate proximal right hydroureteronephrosis. The appearance of the kidneys and bilateral adrenal glands is otherwise unremarkable. Urinary bladder is nearly completely decompressed, but unremarkable in appearance. Stomach/Bowel:  The appearance of the stomach is normal. There is no pathologic dilatation of small bowel or colon. Normal appendix. Vascular/Lymphatic: No significant atherosclerotic disease, aneurysm or dissection noted in the abdominal or pelvic vasculature. No lymphadenopathy noted in the abdomen or pelvis. Reproductive: Uterus and ovaries are unremarkable in appearance. Other: No significant volume of ascites.  No pneumoperitoneum. Musculoskeletal: There are no aggressive appearing lytic or blastic lesions noted in the visualized portions of the skeleton. IMPRESSION: 1. 5 mm obstructing calculus in the distal third of the right ureter with moderate proximal right hydroureteronephrosis. 2. Nonobstructive calculi in both renal collecting systems, measuring up to 7 mm in the lower pole collecting system of the left kidney. Electronically Signed   By: Trudie Reed M.D.   On: 08/17/2023 07:26    Procedures Procedures    Medications Ordered in ED Medications  ketorolac (TORADOL) 30 MG/ML injection 30 mg (30 mg Intravenous Given 08/18/23 0827)  ondansetron (ZOFRAN) injection  4 mg (4 mg Intravenous Given 08/18/23 0828)  lactated ringers bolus 1,000 mL (0 mLs Intravenous Stopped 08/18/23 1308)  HYDROmorphone (DILAUDID) injection 0.5 mg (0.5 mg Intravenous Given 08/18/23 0981)    ED Course/ Medical Decision Making/ A&P                                   47 year old female with history of nephrolithiasis and diagnosis yesterday of 5 mm distal right ureteral stone presents with concern for right flank pain, nausea and vomiting.  Reviewed her visit from yesterday which shows a 5 mm obstructing calculus in the distal third of the right ureter with proximal right hydroureteronephrosis, nonobstructing calculi in both collecting systems.  She had normal renal function.  Her urine showed both leukocytes and hematuria, no bacteria seen, urine culture was sent and pending.  On arrival to the emergency department today, she  is hemodynamically stable, having severe pain.  Plan to recheck labs, given nausea, pain control.  Labs completed personally read interpreted by me show no evidence of urinary tract infection, mild anemia, no leukocytosis, normal renal function and transaminases.  She has had prior lithotripsy performed in [place] and is requesting the same. Will reach out to Urology.   Per Urology--Can offer stent today if more emergent pain control needed, other intervention is difficult to predict based on scheduling, can be seen in short outpatient follow up.  Reevaluated and feeling better. She had prior stent did not tolerate it well and felt pain was worsened.  Feels her pain is controlled enough at this time to go home and call Urology for close outpatient follow up and possible procedure scheduling. Given rx for oxycodone after review in [place] drug database (had 6 tablets written yesterday, reports intolerance to ibuprofen). Sent message to Urology.   Patient discharged in stable condition with understanding of reasons to return.             Final Clinical Impression(s) / ED Diagnoses Final diagnoses:  Nephrolithiasis    Rx / DC Orders ED Discharge Orders          Ordered    oxyCODONE (ROXICODONE) 5 MG immediate release tablet  Every 4 hours PRN        08/18/23 1300    ondansetron (ZOFRAN-ODT) 4 MG disintegrating tablet  Every 8 hours PRN        08/18/23 1300              Alvira Monday, MD 08/18/23 2218

## 2023-08-19 ENCOUNTER — Ambulatory Visit: Payer: Self-pay

## 2023-08-19 NOTE — Telephone Encounter (Signed)
  Chief Complaint: 2-3 x normal facial swelling, Pain from kidney stone - not relieved by pain medication. Bruise at IV site. Chest tightness Symptoms: above Frequency: last night - today Pertinent Negatives: Patient denies  Disposition: [x] ED /[x] Urgent Care (no appt availability in office) / [] Appointment(In office/virtual)/ []  [place] [facility]/ [] Home Care/ [] Refused Recommended Disposition /[] [place] [facility]/ []  Follow-up with PCP Additional Notes: Pt states that she noticed her face swelling last night. Face is now 2-3 times normal. Pain from kidney pain not resolved with pain medication. Pt also states she has chest tightness, or perhaps abdominal tightness. Pt also has a large bruise for recent IV. Advised ED or UC. Pt does not want to go d/t cost. Pt will try to get a ride to ED. She may call EMS if needed.    Reason for Disposition  SEVERE swelling of the entire face  Answer Assessment - Initial Assessment Questions 1. ONSET: "When did the swelling start?" (e.g., minutes, hours, days)     today 2. LOCATION: "What part of the face is swollen?"     Whole face 3. SEVERITY: "How swollen is it?"     2-3 times normal size 4. ITCHING: "Is there any itching?" If Yes, ask: "How much?"   (Scale 1-10; mild, moderate or severe)     no 5. PAIN: "Is the swelling painful to touch?" If Yes, ask: "How painful is it?"   (Scale 1-10; mild, moderate or severe)   - NONE (0): no pain   - MILD (1-3): doesn't interfere with normal activities    - MODERATE (4-7): interferes with normal activities or awakens from sleep    - SEVERE (8-10): excruciating pain, unable to do any normal activities      no 6. FEVER: "Do you have a fever?" If Yes, ask: "What is it, how was it measured, and when did it start?"      no 7. CAUSE: "What do you think is causing the face swelling?"     unknown 8. RECURRENT SYMPTOM: "Have you had face swelling before?" If Yes, ask: "When was the last time?" "What  happened that time?"     no 9. OTHER SYMPTOMS: "Do you have any other symptoms?" (e.g., toothache, leg swelling)     Pain from Kidney stones. Pain medication not working. Bruise at recent IV site, chest tightness  Protocols used: Face Swelling-A-AH

## 2023-08-20 ENCOUNTER — Encounter (HOSPITAL_BASED_OUTPATIENT_CLINIC_OR_DEPARTMENT_OTHER): Payer: Self-pay | Admitting: Urology

## 2023-08-20 NOTE — Progress Notes (Signed)
Talked with patient. Instructions given. Arrival time 0800. Nothing to eat after midnight except sip of water with pain pill if needed. Family member is the driver. Hx and meds reviewed.

## 2023-08-21 NOTE — H&P (Signed)
Office Visit Report     08/18/2023   --------------------------------------------------------------------------------   Krista Peterson  MRN: [id number]  DOB: July 08, 1976, 47 year old Female  SSN: [id number]   PRIMARY CARE:  [place] R. Rankins, MD  PRIMARY CARE FAX:  [phone]  REFERRING:  Turkey R. Rankins, MD  PROVIDER:  Jerilee Field, M.D.  TREATING:  Alen Blew. Lafonda Mosses, M.D.  LOCATION:  [facility] [phone]     --------------------------------------------------------------------------------   CC/HPI: Krista Peterson a 47 year old female presents today to discuss a right ureteral stone. She presented to the emergency department yesterday complaining of acute onset right flank pain. CT scan was obtained and showed a right 5 mm stone in the distal ureter just inside the pelvic inlet with associated hydronephrosis. There were few small calcifications in the right kidney and a 7 mm lower pole stone on the left as well. She is given some medicine in the ED and her symptoms improved but they recurred this morning. Creatinine has been normal and urine today not suspicious for infection.   Patient has had 2 stones before-for the first he had a ESWL and she passed the second.   She denies any fevers or gross hematuria. At the time of our visit she feels okay and her pain is well-controlled.     ALLERGIES: No Allergies    MEDICATIONS: Dymista 137 mcg-50 mcg/spray aerosol, spray with pump Nasal     GU PSH: None   NON-GU PSH: None   GU PMH: Abdominal Pain Unspec, Right flank pain, chronic - 2017 Low back pain, Lower back pain - 2017 Other microscopic hematuria, Microhematuria - 2017 Gross hematuria, Gross hematuria - 2016 Renal calculus, Nephrolithiasis - 2016 Urinary Tract Inf, Unspec site, Urinary tract infection - 2016, Pyuria, - 2016 Hydronephrosis Unspec, Hydronephrosis, right - 2016 Hydroureter, Hydroureter - 2016 Ureteral calculus, Calculus of right  ureter - 2016 History of urolithiasis, History of nephrolithiasis - 2015      PMH Notes: History of kidney stones    NON-GU PMH: Encounter for general adult medical examination without abnormal findings, Encounter for preventive health examination - 2017    FAMILY HISTORY: Kidney Stones - Brother, Father   SOCIAL HISTORY: Marital Status: Married Current Smoking Status: Patient has never smoked.   Tobacco Use Assessment Completed: Used Tobacco in last 30 days? Does not use smokeless tobacco. Social Drinker.  Does not use drugs. Drinks 1 caffeinated drink per day. Has not had a blood transfusion.     Notes: Never a smoker, Father alive and healthy, Alcohol use, Caffeine use, Married, Unemployed, Number of children, Mother alive and healthy   REVIEW OF SYSTEMS:    GU Review Female:   Patient reports frequent urination and burning /pain with urination. Patient denies hard to postpone urination, get up at night to urinate, leakage of urine, stream starts and stops, trouble starting your stream, have to strain to urinate, and being pregnant.  Gastrointestinal (Upper):   Patient reports nausea and vomiting. Patient denies indigestion/ heartburn.  Gastrointestinal (Lower):   Patient denies diarrhea and constipation.  Constitutional:   Patient denies fever, night sweats, weight loss, and fatigue.  Skin:   Patient denies skin rash/ lesion and itching.  Eyes:   Patient denies blurred vision and double vision.  Ears/ Nose/ Throat:   Patient denies sore throat and sinus problems.  Hematologic/Lymphatic:   Patient denies swollen glands and easy bruising.  Cardiovascular:   Patient denies chest pains and leg swelling.  Respiratory:  Patient denies cough and shortness of breath.  Endocrine:   Patient denies excessive thirst.  Musculoskeletal:   Patient denies back pain and joint pain.  Neurological:   Patient denies headaches and dizziness.  Psychologic:   Patient denies depression and anxiety.    Notes: hematuria    VITAL SIGNS:      08/18/2023 02:11 PM  Weight 150 lb / 68.04 kg  Height 62 in / 157.48 cm  BP 108/74 mmHg  Pulse 56 /min  Temperature 97.1 F / 36.1 C  BMI 27.4 kg/m   MULTI-SYSTEM PHYSICAL EXAMINATION:    Constitutional: Well-nourished. No physical deformities. Normally developed. Good grooming.  Neck: Neck symmetrical, not swollen. Normal tracheal position.  Respiratory: No labored breathing, no use of accessory muscles.   Cardiovascular: Normal temperature, normal extremity pulses, no swelling, no varicosities.  Lymphatic: No enlargement of neck, axillae, groin.  Skin: No paleness, no jaundice, no cyanosis. No lesion, no ulcer, no rash.  Neurologic / Psychiatric: Oriented to time, oriented to place, oriented to person. No depression, no anxiety, no agitation.  Gastrointestinal: No mass, no tenderness, no rigidity, non obese abdomen.  Eyes: Normal conjunctivae. Normal eyelids.  Ears, Nose, Mouth, and Throat: Left ear no scars, no lesions, no masses. Right ear no scars, no lesions, no masses. Nose no scars, no lesions, no masses. Normal hearing. Normal lips.  Musculoskeletal: Normal gait and station of head and neck.     Complexity of Data:  Source Of History:  Patient, Medical Record Summary  Records Review:   Previous Doctor Records, Previous Patient Records  Urine Test Review:   Urinalysis  X-Ray Review: Outside CT: Reviewed Films. Reviewed Report. Discussed With Patient.  KUB: Reviewed Films. Discussed With Patient.     PROCEDURES:         KUB - [id number]  A single view of the abdomen is obtained. I believe I can see the stone in the distal right ureter. There are other calcifications on the side that are consistent with phleboliths when compared to the CT scan. The stone in the left kidney is also clearly visible.      Patient confirmed No Neulasta OnPro Device.           Urinalysis w/Scope Dipstick Dipstick Cont'd Micro  Color: Yellow Bilirubin:  Neg mg/dL WBC/hpf: 6 - 16/XWR  Appearance: Cloudy Ketones: Trace mg/dL RBC/hpf: 0 - 2/hpf  Specific Gravity: 1.025 Blood: 1+ ery/uL Bacteria: Mod (26-50/hpf)  pH: 6.0 Protein: 1+ mg/dL Cystals: NS (Not Seen)  Glucose: Neg mg/dL Urobilinogen: 0.2 mg/dL Casts: NS (Not Seen)    Nitrites: Neg Trichomonas: Not Present    Leukocyte Esterase: Trace leu/uL Mucous: Present      Epithelial Cells: 0 - 5/hpf      Yeast: NS (Not Seen)      Sperm: Not Present    ASSESSMENT:      ICD-10 Details  1 GU:   Renal and ureteral calculus - N20.2 Acute, Threat to Bodily Function  2   Ureteral obstruction secondary to calculous - N13.2 Acute, Threat to Bodily Function   PLAN:           Orders X-Rays: KUB          Schedule         Document Letter(s):  Created for Patient: Clinical Summary         Notes:   Reviewed the imaging findings with the patient. She previously had a good experience with ESWL and is most interested  in this treatment modality. As I can see the stone on KUB I believe this is reasonable. We discussed that risk include hematuria, hematoma, infection, sepsis, need for additional procedures, damage to GU tract. All questions answered and green sheet submitted        Next Appointment:      Next Appointment: 08/22/2023 10:00 AM    Appointment Type: Surgery     Location: [facility] [phone]    Provider: Jerilee Field, M.D.    Reason for Visit: NE/OP RIGHT ESWL      E & M CODES: We spent 60 minutes dedicated to evaluation and management time, including face to face interaction, discussions on coordination of care, documentation, result review, and discussion with others as applicable.     * Signed by Alen Blew. Lafonda Mosses, M.D. on 08/18/23 at 4:52 PM (EST)*

## 2023-08-22 ENCOUNTER — Encounter (HOSPITAL_BASED_OUTPATIENT_CLINIC_OR_DEPARTMENT_OTHER): Payer: Self-pay | Admitting: Urology

## 2023-08-22 ENCOUNTER — Ambulatory Visit (HOSPITAL_COMMUNITY): Payer: 59

## 2023-08-22 ENCOUNTER — Encounter (HOSPITAL_BASED_OUTPATIENT_CLINIC_OR_DEPARTMENT_OTHER)
Admission: RE | Disposition: A | Discharge status: Home / Self Care | Payer: Self-pay | Source: Home / Self Care | Attending: Urology

## 2023-08-22 ENCOUNTER — Ambulatory Visit (HOSPITAL_BASED_OUTPATIENT_CLINIC_OR_DEPARTMENT_OTHER)
Admission: RE | Admit: 2023-08-22 | Discharge: 2023-08-22 | Disposition: A | Discharge status: Home / Self Care | Payer: 59 | Attending: Urology | Admitting: Urology

## 2023-08-22 ENCOUNTER — Other Ambulatory Visit: Payer: Self-pay

## 2023-08-22 DIAGNOSIS — N132 Hydronephrosis with renal and ureteral calculous obstruction: Secondary | ICD-10-CM | POA: Insufficient documentation

## 2023-08-22 DIAGNOSIS — Z87442 Personal history of urinary calculi: Secondary | ICD-10-CM | POA: Diagnosis not present

## 2023-08-22 DIAGNOSIS — N201 Calculus of ureter: Secondary | ICD-10-CM | POA: Diagnosis not present

## 2023-08-22 DIAGNOSIS — I878 Other specified disorders of veins: Secondary | ICD-10-CM | POA: Diagnosis not present

## 2023-08-22 DIAGNOSIS — N202 Calculus of kidney with calculus of ureter: Secondary | ICD-10-CM | POA: Diagnosis not present

## 2023-08-22 DIAGNOSIS — Z01818 Encounter for other preprocedural examination: Secondary | ICD-10-CM

## 2023-08-22 HISTORY — PX: EXTRACORPOREAL SHOCK WAVE LITHOTRIPSY: SHX1557

## 2023-08-22 LAB — POCT PREGNANCY, URINE: Preg Test, Ur: NEGATIVE

## 2023-08-22 SURGERY — LITHOTRIPSY, ESWL
Anesthesia: LOCAL | Laterality: Right

## 2023-08-22 MED ORDER — ONDANSETRON HCL 4 MG/2ML IJ SOLN
4.0000 mg | Freq: Once | INTRAMUSCULAR | Status: AC
  Administered 2023-08-22: 4 mg via INTRAVENOUS

## 2023-08-22 MED ORDER — DIAZEPAM 5 MG PO TABS
ORAL_TABLET | ORAL | Status: AC
  Filled 2023-08-22: qty 2

## 2023-08-22 MED ORDER — CIPROFLOXACIN HCL 500 MG PO TABS
500.0000 mg | ORAL_TABLET | ORAL | Status: AC
  Administered 2023-08-22: 500 mg via ORAL

## 2023-08-22 MED ORDER — DIPHENHYDRAMINE HCL 25 MG PO CAPS
ORAL_CAPSULE | ORAL | Status: AC
  Filled 2023-08-22: qty 1

## 2023-08-22 MED ORDER — ONDANSETRON HCL 4 MG/2ML IJ SOLN
INTRAMUSCULAR | Status: AC
  Filled 2023-08-22: qty 2

## 2023-08-22 MED ORDER — DIAZEPAM 5 MG PO TABS
10.0000 mg | ORAL_TABLET | ORAL | Status: AC
  Administered 2023-08-22: 10 mg via ORAL

## 2023-08-22 MED ORDER — SODIUM CHLORIDE 0.9 % IV SOLN
INTRAVENOUS | Status: DC
Start: 2023-08-22 — End: 2023-08-22
  Administered 2023-08-22: 1000 mL via INTRAVENOUS

## 2023-08-22 MED ORDER — DIPHENHYDRAMINE HCL 25 MG PO CAPS
25.0000 mg | ORAL_CAPSULE | ORAL | Status: AC
  Administered 2023-08-22: 25 mg via ORAL

## 2023-08-22 MED ORDER — MORPHINE SULFATE (PF) 2 MG/ML IV SOLN
INTRAVENOUS | Status: AC
  Filled 2023-08-22: qty 1

## 2023-08-22 MED ORDER — CIPROFLOXACIN HCL 500 MG PO TABS
ORAL_TABLET | ORAL | Status: AC
  Filled 2023-08-22: qty 1

## 2023-08-22 MED ORDER — MORPHINE SULFATE (PF) 2 MG/ML IV SOLN
1.0000 mg | Freq: Once | INTRAVENOUS | Status: AC
  Administered 2023-08-22: 1 mg via INTRAVENOUS

## 2023-08-22 NOTE — Op Note (Signed)
Right ureteral stone 5 mm  Right ESWL  Findings: stone well targeted and fragmented well. She may need a staged procedure if she fails to pass the stone or fragments.

## 2023-08-22 NOTE — Interval H&P Note (Signed)
History and Physical Interval Note:  08/22/2023 10:27 AM  Krista Peterson  has presented today for surgery, with the diagnosis of RIGHT URETERAL CALCULUS.  The various methods of treatment have been discussed with the patient and family. After consideration of risks, benefits and other options for treatment, the patient has consented to  Procedure(s) with comments: RIGHT EXTRACORPOREAL SHOCK WAVE LITHOTRIPSY (ESWL) (Right) - 75 MINUTES as a surgical intervention.  The patient's history has been reviewed, patient examined, no change in status, stable for surgery.  I have reviewed the patient's chart and labs.  Questions were answered to the patient's satisfaction.  She has not passed the stone. Stone visible on KUB. She has RLQ and dysuria and reports this is stable through the entire process. She has no fever or chills.    Jerilee Field

## 2023-08-23 ENCOUNTER — Other Ambulatory Visit: Payer: Self-pay

## 2023-08-23 ENCOUNTER — Encounter (HOSPITAL_COMMUNITY): Payer: Self-pay

## 2023-08-23 ENCOUNTER — Emergency Department (HOSPITAL_COMMUNITY)
Admission: EM | Admit: 2023-08-23 | Discharge: 2023-08-23 | Disposition: A | Discharge status: Home / Self Care | Payer: 59 | Attending: Emergency Medicine | Admitting: Emergency Medicine

## 2023-08-23 DIAGNOSIS — R109 Unspecified abdominal pain: Secondary | ICD-10-CM | POA: Insufficient documentation

## 2023-08-23 LAB — URINALYSIS, ROUTINE W REFLEX MICROSCOPIC
Bilirubin Urine: NEGATIVE
Glucose, UA: NEGATIVE mg/dL
Ketones, ur: NEGATIVE mg/dL
Nitrite: NEGATIVE
Protein, ur: NEGATIVE mg/dL
Specific Gravity, Urine: 1.012 (ref 1.005–1.030)
pH: 5 (ref 5.0–8.0)

## 2023-08-23 LAB — CBC WITH DIFFERENTIAL/PLATELET
Abs Immature Granulocytes: 0.01 10*3/uL (ref 0.00–0.07)
Basophils Absolute: 0 10*3/uL (ref 0.0–0.1)
Basophils Relative: 0 %
Eosinophils Absolute: 0.2 10*3/uL (ref 0.0–0.5)
Eosinophils Relative: 3 %
HCT: 35.3 % — ABNORMAL LOW (ref 36.0–46.0)
Hemoglobin: 11.5 g/dL — ABNORMAL LOW (ref 12.0–15.0)
Immature Granulocytes: 0 %
Lymphocytes Relative: 23 %
Lymphs Abs: 1.4 10*3/uL (ref 0.7–4.0)
MCH: 29 pg (ref 26.0–34.0)
MCHC: 32.6 g/dL (ref 30.0–36.0)
MCV: 89.1 fL (ref 80.0–100.0)
Monocytes Absolute: 0.5 10*3/uL (ref 0.1–1.0)
Monocytes Relative: 8 %
Neutro Abs: 4.1 10*3/uL (ref 1.7–7.7)
Neutrophils Relative %: 66 %
Platelets: 231 10*3/uL (ref 150–400)
RBC: 3.96 MIL/uL (ref 3.87–5.11)
RDW: 13 % (ref 11.5–15.5)
WBC: 6.2 10*3/uL (ref 4.0–10.5)
nRBC: 0 % (ref 0.0–0.2)

## 2023-08-23 LAB — BASIC METABOLIC PANEL
Anion gap: 9 (ref 5–15)
BUN: 10 mg/dL (ref 6–20)
CO2: 22 mmol/L (ref 22–32)
Calcium: 8.5 mg/dL — ABNORMAL LOW (ref 8.9–10.3)
Chloride: 106 mmol/L (ref 98–111)
Creatinine, Ser: 1.09 mg/dL — ABNORMAL HIGH (ref 0.44–1.00)
GFR, Estimated: >60 mL/min (ref >60)
Glucose, Bld: 95 mg/dL (ref 70–99)
Potassium: 4.4 mmol/L (ref 3.5–5.1)
Sodium: 137 mmol/L (ref 135–145)

## 2023-08-23 MED ORDER — SODIUM CHLORIDE 0.9 % IV BOLUS
1000.0000 mL | Freq: Once | INTRAVENOUS | Status: AC
  Administered 2023-08-23: 1000 mL via INTRAVENOUS

## 2023-08-23 MED ORDER — MORPHINE SULFATE (PF) 4 MG/ML IV SOLN
4.0000 mg | Freq: Once | INTRAVENOUS | Status: DC
  Filled 2023-08-23: qty 1

## 2023-08-23 MED ORDER — KETOROLAC TROMETHAMINE 10 MG PO TABS: 10.0000 mg | ORAL_TABLET | Freq: Four times a day (QID) | ORAL | 0 refills | Status: AC | PRN

## 2023-08-23 MED ORDER — KETOROLAC TROMETHAMINE 15 MG/ML IJ SOLN
15.0000 mg | Freq: Once | INTRAMUSCULAR | Status: AC
  Administered 2023-08-23: 15 mg via INTRAVENOUS
  Filled 2023-08-23: qty 1

## 2023-08-23 MED ORDER — ONDANSETRON HCL 4 MG PO TABS: 4.0000 mg | ORAL_TABLET | Freq: Four times a day (QID) | ORAL | 0 refills | Status: AC

## 2023-08-23 NOTE — ED Triage Notes (Signed)
Patient reports right sided flank pain. Patient had lithotripsy yesterday. States she is having a worse pain. States the urologist said she might need a stent. Has not taken anything for the pain today.

## 2023-08-23 NOTE — ED Notes (Signed)
Patient up to bathroom. Given urine cup for sample.

## 2023-08-23 NOTE — ED Provider Notes (Signed)
[place] EMERGENCY DEPARTMENT AT [facility] Provider Note   CSN: [id number] Arrival date & time: 08/23/23  1324     History  Chief Complaint  Patient presents with   Flank Pain    Krista Peterson is a 47 y.o. female history of kidney stones status post right ESWL presented for continuing right flank pain.  Patient states that the same pain she has had for the past week and that is her kidney stone pain.  Patient was told by her urologist that she may need a stent if her pain does not improve.  Patient denies any vomiting states she has been nauseous on and off.  Patient was not sent home on any pain meds.  Patient denies fevers, hematuria, chest pain, shortness of breath.  Patient denies any trauma to the area or falls and states this is her normal stone pain that she wants it to resolve.  Home Medications Prior to Admission medications   Medication Sig Start Date End Date Taking? Authorizing Provider  azelastine (ASTELIN) 0.1 % nasal spray Place 1 spray into both nostrils 2 (two) times daily. Use in each nostril as directed    [provider]  ondansetron (ZOFRAN-ODT) 4 MG disintegrating tablet Take 1 tablet (4 mg total) by mouth every 8 (eight) hours as needed for nausea or vomiting. 08/17/23   Gwyneth Sprout, MD  ondansetron (ZOFRAN-ODT) 4 MG disintegrating tablet Take 1 tablet (4 mg total) by mouth every 8 (eight) hours as needed for nausea or vomiting. 08/18/23   Alvira Monday, MD  oxyCODONE (ROXICODONE) 5 MG immediate release tablet Take 1 tablet (5 mg total) by mouth every 4 (four) hours as needed for severe pain (pain score 7-10). 08/18/23   Alvira Monday, MD  oxyCODONE-acetaminophen (PERCOCET/ROXICET) 5-325 MG tablet Take 1 tablet by mouth every 6 (six) hours as needed for severe pain (pain score 7-10). 08/17/23   Gwyneth Sprout, MD  Vitamin D, Ergocalciferol, (DRISDOL) 1.25 MG (50000 UNIT) CAPS capsule Take 50,000 Units by mouth daily. Not started yet     [provider]      Allergies    Augmentin [amoxicillin-pot clavulanate]    Review of Systems   Review of Systems  Genitourinary:  Positive for flank pain.    Physical Exam Updated Vital Signs BP 130/84 (BP Location: Left Arm)   Pulse 80   Temp 98.2 F (36.8 C) (Oral)   Resp 16   Ht 5\' 2"  (1.575 m)   Wt 68.5 kg   LMP 07/17/2023   SpO2 100%   BMI 27.64 kg/m  Physical Exam Constitutional:      General: She is not in acute distress.    Comments: Appears uncomfortable  Cardiovascular:     Rate and Rhythm: Normal rate and regular rhythm.  Skin:    General: Skin is warm and dry.     Capillary Refill: Capillary refill takes less than 2 seconds.  Neurological:     Mental Status: She is alert.     ED Results / Procedures / Treatments   Labs (all labs ordered are listed, but only abnormal results are displayed) Labs Reviewed  BASIC METABOLIC PANEL  CBC WITH DIFFERENTIAL/PLATELET  URINALYSIS, ROUTINE W REFLEX MICROSCOPIC    EKG None  Radiology No results found.  Procedures Procedures    Medications Ordered in ED Medications  morphine (PF) 4 MG/ML injection 4 mg (has no administration in time range)    ED Course/ Medical Decision Making/ A&P  Medical Decision Making Amount and/or Complexity of Data Reviewed Labs: ordered.  Risk Prescription drug management.   [name] 47 y.o. presented today for flank pain. Working DDx that I considered at this time includes, but not limited to, postop pain, MSK, nephrolithiasis, pyelonephritis, AAA, aortic dissection, mesenteric ischemia, RCC, obstructive uropathy, renal infarct/hemorrhage, tumor, biliary colic, pancreatitis, appendicitis, SBO, diverticulitis, shingles, lower lobe pneumonia, ectopic pregnancy, PID/TOA, ovarian torsion, endometriosis.  R/o DDx: MSK, nephrolithiasis, pyelonephritis, AAA, aortic dissection, mesenteric ischemia, RCC, obstructive uropathy,  renal infarct/hemorrhage, tumor, biliary colic, pancreatitis, appendicitis, SBO, diverticulitis, shingles, lower lobe pneumonia, ectopic pregnancy, PID/TOA, ovarian torsion, endometriosis: These are considered less likely due to history of present illness, physical exam, lab/imaging findings  Review of prior external notes: 08/22/2023 op note  Unique Tests and My Interpretation:  CBC: Unremarkable BMP: Unremarkable UA: Rare bacteria Urine culture: Pending  Social Determinants of Health: none  Discussion with Independent Historian: None  Discussion of Management of Tests:  [place], MD Urology  Risk: Medium: prescription drug management  Risk Stratification Score: none  Plan: On exam patient was in no acute distress with stable vitals however does appear uncomfortable on exam.  Patient did have lithotripsy done yesterday by Dr. Mena Goes and was told she may need a stent if her pain is not improved.  Patient on exam did not have any abdominal tenderness but again does appear uncomfortable.  Will get basic labs and urine and give pain meds.  I spoke with urology on-call and they recommend Toradol for creatinine is good and that they will come down to see her.  Patient stable at this time.  Urology went to assess the patient states that patient after her labs come back and she gets a liter of fluids and some Toradol can be discharged with a few days worth of Toradol and follow-up in the clinic as they have discussed this plan with the patient.  Will order these and have the patient follow-up with urology outpatient.  Patient's urine does show some leukocytes however patient is endorsing any dysuria or hematuria and so will not treat for UTI at this time as this most likely from the surgery.  Patient was given return precautions. Patient stable for discharge at this time.  Patient verbalized understanding of plan.  This chart was dictated using voice recognition software.  Despite best efforts  to proofread,  errors can occur which can change the documentation meaning.         Final Clinical Impression(s) / ED Diagnoses Final diagnoses:  None    Rx / DC Orders ED Discharge Orders     None         Remi Deter 08/23/23 1313    Derwood Kaplan, MD 08/24/23 [phone]

## 2023-08-23 NOTE — Discharge Instructions (Signed)
Please follow-up with urologist after his recent ER visit.  Today your labs and imaging are reassuring and you were evaluated by the urology team and they will see him in the office.  I have given you a few days worth of pain meds to take as prescribed.  If symptoms change or worsen please return to the ER.

## 2023-08-23 NOTE — Consult Note (Cosign Needed)
Urology Consult Note   Requesting Attending Physician:  Derwood Kaplan, MD Service Providing Consult: Urology  Consulting Attending: Dr. Rhunette Croft   Reason for Consult:  s/p ESWL postoperative pain  HPI: Krista Peterson is seen in consultation for reasons noted above at the request of Derwood Kaplan, MD for postoperative stone burden after ESWL  This is a 47 y.o. female with history of nephrolithiasis, status post ESWL with Dr. Mena Goes on 08/22/2023.  Patient with ongoing pain, and reports that patient having generalized pelvic pain and nausea after chugging fluids at home.  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  She was at home on multimodal pain agents, but reports that she has not taken many of them because she does not want to take oxycodone.  She has not had any fevers or chills.  Review of systems is otherwise negative.  She states she return to the ED today because she is concerned that she is still having pain, and that this did not happen with her prior ESWL surgery.   Past Medical History: Past Medical History:  Diagnosis Date   Kidney stones     Past Surgical History:  Past Surgical History:  Procedure Laterality Date   LITHOTRIPSY      Medication: Current Facility-Administered Medications  Medication Dose Route Frequency Provider Last Rate Last Admin   morphine (PF) 4 MG/ML injection 4 mg  4 mg Intravenous Once Schuman, James T, PA-C       sodium chloride 0.9 % bolus 1,000 mL  1,000 mL Intravenous Once Schuman, James T, PA-C       Current Outpatient Medications  Medication Sig Dispense Refill   ketorolac (TORADOL) 10 MG tablet Take 1 tablet (10 mg total) by mouth every 6 (six) hours as needed for up to 3 days. 12 tablet 0   ondansetron (ZOFRAN) 4 MG tablet Take 1 tablet (4 mg total) by mouth every 6 (six) hours. 12 tablet 0   azelastine (ASTELIN) 0.1 % nasal spray Place 1 spray into both nostrils 2 (two) times daily. Use  in each nostril as directed     ondansetron (ZOFRAN-ODT) 4 MG disintegrating tablet Take 1 tablet (4 mg total) by mouth every 8 (eight) hours as needed for nausea or vomiting. 10 tablet 0   ondansetron (ZOFRAN-ODT) 4 MG disintegrating tablet Take 1 tablet (4 mg total) by mouth every 8 (eight) hours as needed for nausea or vomiting. 10 tablet 0   oxyCODONE (ROXICODONE) 5 MG immediate release tablet Take 1 tablet (5 mg total) by mouth every 4 (four) hours as needed for severe pain (pain score 7-10). 12 tablet 0   oxyCODONE-acetaminophen (PERCOCET/ROXICET) 5-325 MG tablet Take 1 tablet by mouth every 6 (six) hours as needed for severe pain (pain score 7-10). 6 tablet 0   Vitamin D, Ergocalciferol, (DRISDOL) 1.25 MG (50000 UNIT) CAPS capsule Take 50,000 Units by mouth daily. Not started yet      Allergies: Allergies  Allergen Reactions   Augmentin [Amoxicillin-Pot Clavulanate]     Pt prefers not to take due to causes "very bad yeast infections"    Social History: Social History   Tobacco Use   Smoking status: Never   Smokeless tobacco: Never  Substance Use Topics   Alcohol use: Yes    Comment: rarely   Drug use: No    Family History Family History  Problem Relation Age of Onset   Hypertension Father     Review of Systems 10  systems were reviewed and are negative except as noted specifically in the HPI.  Objective   Vital signs in last 24 hours: BP 130/84 (BP Location: Left Arm)   Pulse 80   Temp 98.2 F (36.8 C) (Oral)   Resp 16   Ht 5\' 2"  (1.575 m)   Wt 68.5 kg   LMP 07/17/2023   SpO2 100%   BMI 27.64 kg/m   Physical Exam General: NAD, A&O, resting, appropriate HEENT: [place]/AT, EOMI, MMM Pulmonary: Normal work of breathing Cardiovascular: HDS, adequate peripheral perfusion Abdomen: Soft, NTTP, nondistended. GU: VS, no CVA tenderness Extremities: warm and well perfused Neuro: Appropriate, no focal neurological deficits  Most Recent Labs: Lab Results  Component  Value Date   WBC 6.2 08/23/2023   HGB 11.5 (L) 08/23/2023   HCT 35.3 (L) 08/23/2023   PLT 231 08/23/2023    Lab Results  Component Value Date   NA 138 08/18/2023   K 3.6 08/18/2023   CL 104 08/18/2023   CO2 27 08/18/2023   BUN 11 08/18/2023   CREATININE 0.67 08/18/2023   CALCIUM 8.7 (L) 08/18/2023    No results found for: "INR", "APTT"   Urine Culture: @[id number] (laburin,org,[id number],[id number])@   IMAGING: No results found.  ------  Assessment:  47 y.o. female with past medical history significant for nephrolithiasis status post ESWL 12/6.  Course has been complicated by significant postoperative pain, requiring repeat CT scan and urgent clinic care visit with [facility].  Patient now in the ED with similar complaints.  Upon clinical examination, she is well-appearing and hemodynamically stable.  Legrand Rams that patient anxiety and bladder discomfort are largely contributing to her symptoms.  Given that she has not taken some of her medications prescribed for pain at home, would benefit from multimodal therapy.  We did discuss surgical intervention, but believe that she is a very high spontaneous passage of any residual stones if she stays well-hydrated, takes her multimodal pain agents.   Recommendations: -Recommend 1 L of normal saline to help dispel stones in the right ureter -Patient should take all medications given from yesterday's clinic, including oxycodone and Robaxin, in addition to Tylenol and Flomax. -Patient can be prescribed additionally p.o. Toradol to take for 5 additional days -Defer further surgical intervention at this time   Thank you for this consult. Please contact the urology consult pager with any further questions/concerns.  Addendum: Returned to discuss possible stent vs DC with patient after Cr returned at 1.1 from baseline of 0.6. ED had already discharged patient. Patient was given return precautions yesterday as well as prior to [place] attempted to  speak with her again.   I was present for the interview and exam I agree with the assessment and plan  Sherle Poe MD

## 2023-08-24 LAB — URINE CULTURE: Culture: NO GROWTH

## 2023-08-25 ENCOUNTER — Encounter (HOSPITAL_BASED_OUTPATIENT_CLINIC_OR_DEPARTMENT_OTHER): Payer: Self-pay | Admitting: Urology

## 2023-09-05 DIAGNOSIS — R8271 Bacteriuria: Secondary | ICD-10-CM | POA: Diagnosis not present

## 2023-09-05 DIAGNOSIS — N202 Calculus of kidney with calculus of ureter: Secondary | ICD-10-CM | POA: Diagnosis not present

## 2023-09-11 DIAGNOSIS — R509 Fever, unspecified: Secondary | ICD-10-CM | POA: Diagnosis not present

## 2023-09-11 DIAGNOSIS — J019 Acute sinusitis, unspecified: Secondary | ICD-10-CM | POA: Diagnosis not present

## 2023-09-11 DIAGNOSIS — R051 Acute cough: Secondary | ICD-10-CM | POA: Diagnosis not present

## 2023-09-11 DIAGNOSIS — R0981 Nasal congestion: Secondary | ICD-10-CM | POA: Diagnosis not present

## 2023-09-30 DIAGNOSIS — N390 Urinary tract infection, site not specified: Secondary | ICD-10-CM | POA: Diagnosis not present

## 2024-07-16 DIAGNOSIS — J208 Acute bronchitis due to other specified organisms: Secondary | ICD-10-CM | POA: Diagnosis not present

## 2024-07-16 DIAGNOSIS — R062 Wheezing: Secondary | ICD-10-CM | POA: Diagnosis not present

## 2024-09-02 ENCOUNTER — Encounter: Payer: Self-pay | Admitting: *Deleted

## 2024-09-02 NOTE — Progress Notes (Signed)
 Juley Giovanetti                                          MRN: [id number]   09/02/2024   The [facility] Quality Team Specialist reviewed this patient medical record for the purposes of chart review for care gap closure. The following were reviewed: chart review for care gap closure-colorectal cancer screening.    [facility] Quality Team

## 2024-09-10 DIAGNOSIS — K219 Gastro-esophageal reflux disease without esophagitis: Secondary | ICD-10-CM | POA: Diagnosis not present

## 2024-09-10 DIAGNOSIS — J309 Allergic rhinitis, unspecified: Secondary | ICD-10-CM | POA: Diagnosis not present
